# Patient Record
Sex: Male | Born: 1985
Health system: Southern US, Community
[De-identification: ages and names within clinical notes are randomized; demographics above are authoritative.]

## PROBLEM LIST (undated history)

## (undated) DIAGNOSIS — K219 Gastro-esophageal reflux disease without esophagitis: Secondary | ICD-10-CM

## (undated) DIAGNOSIS — N200 Calculus of kidney: Secondary | ICD-10-CM

## (undated) HISTORY — PX: NO PAST SURGERIES: SHX2092

---

## 2010-08-29 ENCOUNTER — Ambulatory Visit: Payer: Self-pay

## 2010-09-09 ENCOUNTER — Ambulatory Visit: Payer: Self-pay

## 2013-03-13 ENCOUNTER — Emergency Department: Payer: Self-pay | Admitting: Emergency Medicine

## 2015-05-20 DIAGNOSIS — R6882 Decreased libido: Secondary | ICD-10-CM | POA: Insufficient documentation

## 2015-05-20 DIAGNOSIS — K219 Gastro-esophageal reflux disease without esophagitis: Secondary | ICD-10-CM | POA: Insufficient documentation

## 2015-05-21 ENCOUNTER — Ambulatory Visit (INDEPENDENT_AMBULATORY_CARE_PROVIDER_SITE_OTHER): Payer: 59 | Admitting: Family Medicine

## 2015-05-21 ENCOUNTER — Encounter: Payer: Self-pay | Admitting: Family Medicine

## 2015-05-21 VITALS — BP 140/90 | HR 70 | Temp 98.0°F | Resp 17 | Wt 253.0 lb

## 2015-05-21 DIAGNOSIS — L259 Unspecified contact dermatitis, unspecified cause: Secondary | ICD-10-CM | POA: Diagnosis not present

## 2015-05-21 MED ORDER — PREDNISONE 10 MG PO TABS: 10.0000 mg | ORAL_TABLET | Freq: Every day | ORAL | Status: DC

## 2015-05-21 NOTE — Progress Notes (Signed)
Patient: Troy Sawyer Male    DOB: 1986/02/01   29 y.o.   MRN: [id number] Visit Date: 05/21/2015  Today's Provider: Dortha Kern, PA   Chief Complaint  Patient presents with  . Rash    Possible Poison Oak; arms, legs, and stomach   Subjective:    Rash This is a new problem. The current episode started in the past 7 days. The problem has been gradually worsening since onset. The affected locations include the left arm, right arm, right lowerleg, right upper leg, left lower leg and left upper leg. The rash is characterized by draining, blistering, itchiness, redness and swelling. He was exposed to plant contact. Associated symptoms include facial edema. Pertinent negatives include no fever, joint pain or sore throat. (Just eyes a little puffy) Past treatments include anti-itch cream (cortisone 2.5% and  1%). The treatment provided no relief.     History reviewed. No pertinent past medical history. Patient Active Problem List   Diagnosis Date Noted  . Decreased libido 05/20/2015  . Acid reflux 05/20/2015   Family History  Problem Relation Age of Onset  . Healthy Mother   . Diabetes Father   . Healthy Sister   . Healthy Brother   . Lung cancer Paternal Grandfather   . Liver cancer Paternal Grandfather   . Healthy Sister   . Healthy Brother   . Healthy Brother    Past Surgical History  Procedure Laterality Date  . No past surgeries      No Known Allergies   Previous Medications   RANITIDINE (ZANTAC) 75 MG TABLET    Take 1 tablet by mouth as needed.    Review of Systems  Constitutional: Negative for fever.  HENT: Negative for sore throat.   Musculoskeletal: Negative for joint pain.  Skin: Positive for rash.    History  Substance Use Topics  . Smoking status: Former Games developer  . Smokeless tobacco: Never Used     Comment: quit in 2004  . Alcohol Use: 0.0 oz/week    0 Standard drinks or equivalent per week     Comment: OCCASIONALLY   Objective:   BP 140/90  mmHg  Pulse 70  Temp(Src) 98 F (36.7 C) (Oral)  Resp 17  Wt 253 lb (114.76 kg)  Physical Exam  Constitutional: He is oriented to person, place, and time. He appears well-developed and well-nourished. No distress.  HENT:  Head: Normocephalic and atraumatic.  Right Ear: Hearing normal.  Left Ear: Hearing normal.  Nose: Nose normal.  Eyes: Conjunctivae and lids are normal. Right eye exhibits no discharge. Left eye exhibits no discharge. No scleral icterus.  Pulmonary/Chest: Effort normal. No respiratory distress.  Musculoskeletal: Normal range of motion.  Neurological: He is alert and oriented to person, place, and time.  Skin: Skin is intact. Rash noted. No lesion noted.  Large areas of vesicular pruritic rash in a linear pattern on forearms, inside of upper arms, heavily around ankles up lower legs and top of feet. Couple of very small lesions on abdomen and some itching around left eye.  Psychiatric: He has a normal mood and affect. His speech is normal and behavior is normal. Thought content normal.      Assessment & Plan:     1. Contact dermatitis Onset of very pruritic rash over the past 7 days. Started after mowing in yard. Sprayed all weeds he could find after starting the itching. Suspect rhus dermatitis. Will treat with prednisone taper, antihistamine (OTC) and  [place] compresses/soaks. Recheck prn. - predniSONE (DELTASONE) 10 MG tablet; Take 1 tablet (10 mg total) by mouth daily with breakfast. Taper as directed by mouth (6,5,4,3,2,1)  Dispense: 21 tablet; Refill: 0       Dortha Kern, PA  [facility]

## 2015-05-21 NOTE — Patient Instructions (Signed)

## 2015-09-02 ENCOUNTER — Ambulatory Visit (INDEPENDENT_AMBULATORY_CARE_PROVIDER_SITE_OTHER): Payer: 59 | Admitting: Family Medicine

## 2015-09-02 ENCOUNTER — Encounter: Payer: Self-pay | Admitting: Family Medicine

## 2015-09-02 VITALS — BP 128/84 | HR 88 | Temp 98.3°F | Resp 18 | Wt 254.8 lb

## 2015-09-02 DIAGNOSIS — R05 Cough: Secondary | ICD-10-CM | POA: Diagnosis not present

## 2015-09-02 DIAGNOSIS — J0100 Acute maxillary sinusitis, unspecified: Secondary | ICD-10-CM

## 2015-09-02 DIAGNOSIS — R6883 Chills (without fever): Secondary | ICD-10-CM | POA: Diagnosis not present

## 2015-09-02 DIAGNOSIS — R059 Cough, unspecified: Secondary | ICD-10-CM

## 2015-09-02 LAB — POC INFLUENZA A&B (BINAX/QUICKVUE)
INFLUENZA A, POC: NEGATIVE
INFLUENZA B, POC: NEGATIVE

## 2015-09-02 MED ORDER — AMOXICILLIN 875 MG PO TABS: 875.0000 mg | ORAL_TABLET | Freq: Two times a day (BID) | ORAL | Status: DC

## 2015-09-02 NOTE — Progress Notes (Signed)
Patient ID: Troy Sawyer, male   DOB: 1986/06/08, 29 y.o.   MRN: [id number] Name: Troy Sawyer   MRN: [id number]    DOB: Jun 06, 1986   Date:09/02/2015       Progress Note  Subjective  Chief Complaint  Chief Complaint  Patient presents with  . URI   URI  This is a new problem. The current episode started in the past 7 days. The problem has been gradually worsening. Associated symptoms include congestion, coughing, headaches and a sore throat. Associated symptoms comments: Some chills and sweats.. He has tried decongestant for the symptoms. The treatment provided no relief.   Patient Active Problem List   Diagnosis Date Noted  . Decreased libido 05/20/2015  . Acid reflux 05/20/2015   Family History  Problem Relation Age of Onset  . Healthy Mother   . Diabetes Father   . Healthy Sister   . Healthy Brother   . Lung cancer Paternal Grandfather   . Liver cancer Paternal Grandfather   . Healthy Sister   . Healthy Brother   . Healthy Brother    Past Surgical History  Procedure Laterality Date  . No past surgeries     Social History  Substance Use Topics  . Smoking status: Former Games developer  . Smokeless tobacco: Never Used     Comment: quit in 2004  . Alcohol Use: 0.0 oz/week    0 Standard drinks or equivalent per week     Comment: OCCASIONALLY    Current outpatient prescriptions:  .  ranitidine (ZANTAC) 75 MG tablet, Take 1 tablet by mouth as needed., Disp: , Rfl:   No Known Allergies  Review of Systems  Constitutional: Negative.   HENT: Positive for congestion and sore throat.   Eyes: Negative.   Respiratory: Positive for cough.   Cardiovascular: Negative.   Gastrointestinal: Negative.   Genitourinary: Negative.   Musculoskeletal: Negative.   Skin: Negative.   Neurological: Positive for headaches.  Endo/Heme/Allergies: Negative.   Psychiatric/Behavioral: Negative.    Objective  Filed Vitals:   09/02/15 1053  BP: 128/84  Pulse: 88  Temp: 98.3 F (36.8 C)   TempSrc: Oral  Resp: 18  Weight: 254 lb 12.8 oz (115.577 kg)  SpO2: 97%   Physical Exam  Constitutional: He is oriented to person, place, and time and well-developed, well-nourished, and in no distress.  HENT:  Head: Normocephalic and atraumatic.  Right Ear: External ear normal.  Left Ear: External ear normal.  Prominent tonsils without exudates. Ache in maxillary and frontal sinuses with poor transillumination.  Eyes: Conjunctivae and EOM are normal.  Neck: Normal range of motion. Neck supple.  Cardiovascular: Normal rate, regular rhythm and normal heart sounds.   Pulmonary/Chest: Effort normal. He has no wheezes. He has no rales.  Coarse breath sounds with questionable rhonchi initially that clears with a deep breath.  Musculoskeletal: Normal range of motion.  Lymphadenopathy:    He has no cervical adenopathy.  Neurological: He is alert and oriented to person, place, and time.  Skin: Skin is warm and dry.  Psychiatric: Affect and judgment normal.   Assessment & Plan  1. Chills Onset 08-30-15 without documented fever. Flu test negative. May use Advil prn and increase fluid intake.  - POC Influenza A&B (Binax test)  2. Cough Some yellow is sputum at night. May use Mucinex-DM and take all antibiotics given. Recheck prn. - POC Influenza A&B (Binax test)  3. Subacute maxillary sinusitis Onset over the past 2-3 days. No transillumination of maxillary sinuses.  Use saline nasal spray and add antibiotic. Recheck if no better in 5-7 days. - amoxicillin (AMOXIL) 875 MG tablet; Take 1 tablet (875 mg total) by mouth 2 (two) times daily.  Dispense: 20 tablet; Refill: 0

## 2016-03-24 ENCOUNTER — Ambulatory Visit (INDEPENDENT_AMBULATORY_CARE_PROVIDER_SITE_OTHER): Payer: 59 | Admitting: Family Medicine

## 2016-03-24 ENCOUNTER — Encounter: Payer: Self-pay | Admitting: Family Medicine

## 2016-03-24 VITALS — BP 128/76 | HR 68 | Temp 98.1°F | Resp 16 | Wt 250.0 lb

## 2016-03-24 DIAGNOSIS — R109 Unspecified abdominal pain: Secondary | ICD-10-CM | POA: Diagnosis not present

## 2016-03-24 DIAGNOSIS — K219 Gastro-esophageal reflux disease without esophagitis: Secondary | ICD-10-CM

## 2016-03-24 LAB — POCT UA - MICROSCOPIC ONLY

## 2016-03-24 LAB — POCT URINALYSIS DIPSTICK
GLUCOSE UA: NEGATIVE
Leukocytes, UA: NEGATIVE
Nitrite, UA: NEGATIVE
SPEC GRAV UA: 1.025
UROBILINOGEN UA: 0.2
pH, UA: 6

## 2016-03-24 MED ORDER — CIPROFLOXACIN HCL 500 MG PO TABS: 500.0000 mg | ORAL_TABLET | Freq: Two times a day (BID) | ORAL | Status: DC

## 2016-03-24 NOTE — Progress Notes (Signed)
Patient ID: Troy Sawyer, male   DOB: [birth year], 30 y.o.   MRN: [id number]       Patient: Troy Sawyer Male    DOB: June 07, 1986   30 y.o.   MRN: [id number] Visit Date: 03/24/2016  Today's Provider: Dortha Kern, PA   Chief Complaint  Patient presents with  . Gastroesophageal Reflux    X 1 week.    Subjective:    Gastroesophageal Reflux He complains of belching and heartburn. He reports no abdominal pain or no nausea. This is a recurrent problem. The current episode started in the past 7 days. The problem occurs frequently. The problem has been gradually worsening. The heartburn is of moderate intensity. The symptoms are aggravated by certain foods and lying down. He has tried a PPI and an antacid for the symptoms. The treatment provided mild relief.  Had a sharp pain in LLQ a week ago but only lasted a few minutes. Couple days later had N&V after a night of heavy drinking then developed another bout of LLQ pain that radiated to the left flank. Denies dysuria or fever. Had an intense pain in the flank last night with darker urine.   No past medical history on file. Patient Active Problem List   Diagnosis Date Noted  . Decreased libido 05/20/2015  . Acid reflux 05/20/2015   Past Surgical History  Procedure Laterality Date  . No past surgeries     Family History  Problem Relation Age of Onset  . Healthy Mother   . Diabetes Father   . Healthy Sister   . Healthy Brother   . Lung cancer Paternal Grandfather   . Liver cancer Paternal Grandfather   . Healthy Sister   . Healthy Brother   . Healthy Brother    No Known Allergies   Previous Medications   RANITIDINE (ZANTAC) 75 MG TABLET    Take 1 tablet by mouth as needed.    Review of Systems  Constitutional: Negative.   Cardiovascular: Negative.   Gastrointestinal: Positive for heartburn. Negative for nausea, vomiting, abdominal pain, diarrhea and constipation.    Social History  Substance Use Topics  . Smoking status:  Former Games developer  . Smokeless tobacco: Never Used     Comment: quit in 2004  . Alcohol Use: 0.0 oz/week    0 Standard drinks or equivalent per week     Comment: OCCASIONALLY   Objective:   BP 128/76 mmHg  Pulse 68  Temp(Src) 98.1 F (36.7 C)  Resp 16  Wt 250 lb (113.399 kg)  Physical Exam  Constitutional: He is oriented to person, place, and time. He appears well-developed and well-nourished. No distress.  HENT:  Head: Normocephalic and atraumatic.  Right Ear: Hearing normal.  Left Ear: Hearing normal.  Nose: Nose normal.  Eyes: Conjunctivae and lids are normal. Right eye exhibits no discharge. Left eye exhibits no discharge. No scleral icterus.  Neck: Neck supple.  Cardiovascular: Regular rhythm.   Pulmonary/Chest: Effort normal and breath sounds normal. No respiratory distress.  Abdominal: Bowel sounds are normal. There is tenderness.  Slight left CVA tenderness to percussion posteriorly and around flank to LLQ to palpation.  Musculoskeletal: Normal range of motion.  Neurological: He is alert and oriented to person, place, and time.  Skin: Skin is intact. No lesion and no rash noted.  Psychiatric: He has a normal mood and affect. His speech is normal and behavior is normal. Thought content normal.      Assessment & Plan:  1. Gastroesophageal reflux disease, esophagitis presence not specified Worse over the past week. Denies hematemesis or melena. Recommend he increase Zantac use to 150 mg BID and limit ETOH or spicy foods. Will check labs and recheck pending reports.  - CBC with Differential/Platelet - Comprehensive metabolic panel  2. Left flank pain Onset over the past week intermittently with N&V. Denies dysuria but flank pain very intense when it occurs. Urinalysis showed some WBC's and bacteria with RBC's. Will check renal ultrasound and urine culture. Given Cipro 500 mg BID and recheck pending reports. - POCT urinalysis dipstick - US Renal - CBC with  Differential/Platelet - Comprehensive metabolic panel       Dortha Kernennis Chrismon, PA  [facility]

## 2016-03-25 LAB — COMPREHENSIVE METABOLIC PANEL
A/G RATIO: 1.8 (ref 1.2–2.2)
ALK PHOS: 63 IU/L (ref 39–117)
ALT: 25 IU/L (ref 0–44)
AST: 14 IU/L (ref 0–40)
Albumin: 4.7 g/dL (ref 3.5–5.5)
BILIRUBIN TOTAL: 0.6 mg/dL (ref 0.0–1.2)
BUN / CREAT RATIO: 12 (ref 9–20)
BUN: 14 mg/dL (ref 6–20)
CALCIUM: 9.5 mg/dL (ref 8.7–10.2)
CHLORIDE: 99 mmol/L (ref 96–106)
CO2: 25 mmol/L (ref 18–29)
Creatinine, Ser: 1.17 mg/dL (ref 0.76–1.27)
GFR calc Af Amer: 95 mL/min/{1.73_m2} (ref >59)
GFR calc non Af Amer: 82 mL/min/{1.73_m2} (ref >59)
Globulin, Total: 2.6 g/dL (ref 1.5–4.5)
Glucose: 90 mg/dL (ref 65–99)
Potassium: 4.7 mmol/L (ref 3.5–5.2)
Sodium: 140 mmol/L (ref 134–144)
TOTAL PROTEIN: 7.3 g/dL (ref 6.0–8.5)

## 2016-03-25 LAB — CBC WITH DIFFERENTIAL/PLATELET
BASOS: 0 %
Basophils Absolute: 0 10*3/uL (ref 0.0–0.2)
EOS (ABSOLUTE): 0.3 10*3/uL (ref 0.0–0.4)
EOS: 4 %
Hematocrit: 44.3 % (ref 37.5–51.0)
Hemoglobin: 15 g/dL (ref 12.6–17.7)
IMMATURE GRANS (ABS): 0 10*3/uL (ref 0.0–0.1)
Immature Granulocytes: 0 %
Lymphocytes Absolute: 1.9 10*3/uL (ref 0.7–3.1)
Lymphs: 28 %
MCH: 30.3 pg (ref 26.6–33.0)
MCHC: 33.9 g/dL (ref 31.5–35.7)
MCV: 90 fL (ref 79–97)
MONOS ABS: 0.7 10*3/uL (ref 0.1–0.9)
Monocytes: 11 %
NEUTROS ABS: 3.8 10*3/uL (ref 1.4–7.0)
NEUTROS PCT: 57 %
Platelets: 224 10*3/uL (ref 150–379)
RBC: 4.95 x10E6/uL (ref 4.14–5.80)
RDW: 12.9 % (ref 12.3–15.4)
WBC: 6.8 10*3/uL (ref 3.4–10.8)

## 2016-03-25 LAB — URINE CULTURE: Organism ID, Bacteria: NO GROWTH

## 2016-03-26 ENCOUNTER — Ambulatory Visit
Admission: RE | Admit: 2016-03-26 | Discharge: 2016-03-26 | Disposition: A | Discharge status: Home / Self Care | Payer: 59 | Source: Ambulatory Visit | Attending: Family Medicine | Admitting: Family Medicine

## 2016-03-26 DIAGNOSIS — N1330 Unspecified hydronephrosis: Secondary | ICD-10-CM | POA: Insufficient documentation

## 2016-03-26 DIAGNOSIS — R109 Unspecified abdominal pain: Secondary | ICD-10-CM | POA: Diagnosis present

## 2016-03-27 ENCOUNTER — Telehealth: Payer: Self-pay | Admitting: Family Medicine

## 2016-03-27 ENCOUNTER — Ambulatory Visit: Payer: 59 | Admitting: Family Medicine

## 2016-03-27 DIAGNOSIS — N200 Calculus of kidney: Secondary | ICD-10-CM

## 2016-03-27 MED ORDER — TAMSULOSIN HCL 0.4 MG PO CAPS: 0.4000 mg | ORAL_CAPSULE | Freq: Every day | ORAL | Status: DC

## 2016-03-27 MED ORDER — HYDROCODONE-ACETAMINOPHEN 5-325 MG PO TABS: 1.0000 | ORAL_TABLET | Freq: Four times a day (QID) | ORAL | Status: DC | PRN

## 2016-03-27 NOTE — Telephone Encounter (Signed)
Patient wanted to know due to the kidney stones, does he need to stay away from certain foods? Also, patient wanted to know if he could have some pain medication to help with his occasional pain that he mentioned to you during his OV. Patient also wanted pain medication just in case passing the stone will cause him to be in a lot of pain.

## 2016-03-27 NOTE — Telephone Encounter (Signed)
Advise patient a prescription for pain medication will be at the front desk for pick up. If he can't get here today, the office will be open 9am-12pm tomorrow. This type of scheduled drug must be written and hand signed - can't be electronically transmitted to the pharmacy. Remember these drugs can cause drowsiness/sleepiness.

## 2016-03-27 NOTE — Telephone Encounter (Signed)
Pt wants to talk to you regarding his US yesterday on his kidneys.  His call back is [phone]  Thanks Barth Kirkseri

## 2016-03-27 NOTE — Telephone Encounter (Signed)
Pt is requesting a call back to discuss kidney stones that were found today.  ZO#[phone]/WJCB#[phone]/MW

## 2016-03-27 NOTE — Telephone Encounter (Signed)
Advised patient as below.  

## 2016-03-27 NOTE — Telephone Encounter (Signed)
-----   Message from Dennis Tamsen RoersE Chrismon, [place] sent at 03/27/2016  8:56 AM EDT ----- Some mild swelling of both kidneys. Some stones in the left kidney. Recommend adding Flomax 0.4 mg qd #30 (phone into his pharmacy) to help with relieving pressure on kidneys and hopefully passing stones. Recheck urine and renal function blood tests in 10-14 days.

## 2016-03-27 NOTE — Telephone Encounter (Signed)
Called patient and advised as below. Medication was called into the pharmacy. Patient reports that he will call back and schedule and appt due to work schedule.

## 2016-03-31 ENCOUNTER — Telehealth: Payer: Self-pay | Admitting: Emergency Medicine

## 2016-03-31 NOTE — Telephone Encounter (Signed)
Pt would like a call back from nurse/him, he has questions regarding being treated for kidney stone.

## 2016-03-31 NOTE — Telephone Encounter (Signed)
Spoke with patient and asked about drinking caffeine, and what to do to speed the process up for passing kidney stones. Went over precautions and educational material in regards to the issue with patient-aa

## 2016-04-09 ENCOUNTER — Telehealth: Payer: Self-pay | Admitting: Urology

## 2016-04-09 ENCOUNTER — Emergency Department: Payer: Commercial Managed Care - HMO

## 2016-04-09 ENCOUNTER — Encounter: Payer: Self-pay | Admitting: Emergency Medicine

## 2016-04-09 ENCOUNTER — Emergency Department
Admission: EM | Admit: 2016-04-09 | Discharge: 2016-04-09 | Disposition: A | Discharge status: Home / Self Care | Payer: Commercial Managed Care - HMO | Attending: Emergency Medicine | Admitting: Emergency Medicine

## 2016-04-09 DIAGNOSIS — R109 Unspecified abdominal pain: Secondary | ICD-10-CM

## 2016-04-09 DIAGNOSIS — N23 Unspecified renal colic: Secondary | ICD-10-CM

## 2016-04-09 DIAGNOSIS — Z79899 Other long term (current) drug therapy: Secondary | ICD-10-CM | POA: Insufficient documentation

## 2016-04-09 DIAGNOSIS — Z87891 Personal history of nicotine dependence: Secondary | ICD-10-CM | POA: Insufficient documentation

## 2016-04-09 DIAGNOSIS — N200 Calculus of kidney: Secondary | ICD-10-CM

## 2016-04-09 DIAGNOSIS — R1032 Left lower quadrant pain: Secondary | ICD-10-CM | POA: Diagnosis present

## 2016-04-09 DIAGNOSIS — R10A0 Flank pain, unspecified side: Secondary | ICD-10-CM

## 2016-04-09 LAB — BASIC METABOLIC PANEL
Anion gap: 11 (ref 5–15)
BUN: 21 mg/dL — AB (ref 6–20)
CO2: 24 mmol/L (ref 22–32)
CREATININE: 1.62 mg/dL — AB (ref 0.61–1.24)
Calcium: 9.4 mg/dL (ref 8.9–10.3)
Chloride: 104 mmol/L (ref 101–111)
GFR calc Af Amer: >60 mL/min (ref >60)
GFR, EST NON AFRICAN AMERICAN: 55 mL/min — AB (ref >60)
GLUCOSE: 125 mg/dL — AB (ref 65–99)
POTASSIUM: 4.1 mmol/L (ref 3.5–5.1)
SODIUM: 139 mmol/L (ref 135–145)

## 2016-04-09 LAB — CBC
HCT: 40.5 % (ref 40.0–52.0)
Hemoglobin: 14 g/dL (ref 13.0–18.0)
MCH: 30.2 pg (ref 26.0–34.0)
MCHC: 34.6 g/dL (ref 32.0–36.0)
MCV: 87.4 fL (ref 80.0–100.0)
PLATELETS: 211 10*3/uL (ref 150–440)
RBC: 4.63 MIL/uL (ref 4.40–5.90)
RDW: 12.4 % (ref 11.5–14.5)
WBC: 11.3 10*3/uL — ABNORMAL HIGH (ref 3.8–10.6)

## 2016-04-09 LAB — URINALYSIS COMPLETE WITH MICROSCOPIC (ARMC ONLY)
BILIRUBIN URINE: NEGATIVE
Bacteria, UA: NONE SEEN
Glucose, UA: NEGATIVE mg/dL
Leukocytes, UA: NEGATIVE
Nitrite: NEGATIVE
PH: 6 (ref 5.0–8.0)
PROTEIN: NEGATIVE mg/dL
SQUAMOUS EPITHELIAL / LPF: NONE SEEN
Specific Gravity, Urine: 1.021 (ref 1.005–1.030)

## 2016-04-09 MED ORDER — ONDANSETRON 4 MG PO TBDP: 4.0000 mg | ORAL_TABLET | Freq: Three times a day (TID) | ORAL | Status: DC | PRN

## 2016-04-09 MED ORDER — MORPHINE SULFATE (PF) 4 MG/ML IV SOLN
INTRAVENOUS | Status: AC
  Administered 2016-04-09: 4 mg via INTRAVENOUS
  Filled 2016-04-09: qty 1

## 2016-04-09 MED ORDER — MORPHINE SULFATE (PF) 4 MG/ML IV SOLN
4.0000 mg | Freq: Once | INTRAVENOUS | Status: AC
  Administered 2016-04-09: 4 mg via INTRAVENOUS

## 2016-04-09 MED ORDER — SODIUM CHLORIDE 0.9 % IV BOLUS (SEPSIS)
1000.0000 mL | Freq: Once | INTRAVENOUS | Status: AC
  Administered 2016-04-09: 1000 mL via INTRAVENOUS

## 2016-04-09 MED ORDER — ONDANSETRON HCL 4 MG/2ML IJ SOLN
INTRAMUSCULAR | Status: AC
  Administered 2016-04-09: 4 mg via INTRAVENOUS
  Filled 2016-04-09: qty 2

## 2016-04-09 MED ORDER — OXYCODONE-ACETAMINOPHEN 5-325 MG PO TABS: 1.0000 | ORAL_TABLET | Freq: Four times a day (QID) | ORAL | Status: DC | PRN

## 2016-04-09 MED ORDER — MORPHINE SULFATE (PF) 2 MG/ML IV SOLN
INTRAVENOUS | Status: AC
  Administered 2016-04-09: 4 mg via INTRAVENOUS
  Filled 2016-04-09: qty 2

## 2016-04-09 MED ORDER — MORPHINE SULFATE (PF) 4 MG/ML IV SOLN
4.0000 mg | Freq: Once | INTRAVENOUS | Status: AC
Start: 2016-04-09 — End: 2016-04-09
  Administered 2016-04-09: 4 mg via INTRAVENOUS
  Filled 2016-04-09: qty 1

## 2016-04-09 MED ORDER — ONDANSETRON HCL 4 MG/2ML IJ SOLN
4.0000 mg | Freq: Once | INTRAMUSCULAR | Status: AC
  Administered 2016-04-09: 4 mg via INTRAVENOUS

## 2016-04-09 MED ORDER — OXYCODONE-ACETAMINOPHEN 5-325 MG PO TABS: 1.0000 | ORAL_TABLET | Freq: Once | ORAL | Status: DC

## 2016-04-09 NOTE — ED Notes (Addendum)
Pt reports left flank/side pain since 530pm yesterday accomp by N/V; [facility]; denies hx of same

## 2016-04-09 NOTE — Telephone Encounter (Signed)
Notified pt of surgery scheduled 04/13/16 with arrival time to pre-admit testing at 7:30am. Advised pt to be npo after mn, do not take any medications but bring them to the appt & to have a driver accompany pt. Pt voices understanding.

## 2016-04-09 NOTE — ED Notes (Signed)
Pt to CT

## 2016-04-09 NOTE — Consult Note (Signed)
Urology Consult  I have been asked to see the patient by Dr. Zenda AlpersWebster, for evaluation and management of left ureteral stone.  Chief Complaint: left flank pain  History of Present Illness: Troy Sawyer is a 30 y.o. year old who presented to the emergency room today with acute onset left flank pain. This has intermittently occurring from at least 3 weeks with episodes of severe pain with associated nausea and vomiting.  The pain is primarily in his left lower quadrant radiating down to his left groin.  This morning, the pain was so severe that it brought him to the emergency room.  He has been taking Flomax and pain meds prescribed by his PCP, Dr. Dossie Arbourrissman.  He denies any fevers or chills. No leukocytosis. UA is unremarkable. His creatinine is mildly elevated to 1.5 although he is quite muscular. Baseline creatinine unknown.  CT scan shows a 6 mm left mid/distal ureteral stone around the level of the iliacs. There is proximal hydroureteronephrosis. There are no upper tract nonobstructing nephrolithiasis bilaterally.  No previous history of kidney stones.  History reviewed. No pertinent past medical history.   (GERD)  Past Surgical History  Procedure Laterality Date  . No past surgeries      Home Medications:    Medication List    TAKE these medications        ondansetron 4 MG disintegrating tablet  Commonly known as:  ZOFRAN ODT  Take 1 tablet (4 mg total) by mouth every 8 (eight) hours as needed for nausea or vomiting.     oxyCODONE-acetaminophen 5-325 MG tablet  Commonly known as:  ROXICET  Take 1 tablet by mouth every 6 (six) hours as needed.      ASK your doctor about these medications        ciprofloxacin 500 MG tablet  Commonly known as:  CIPRO  Take 1 tablet (500 mg total) by mouth 2 (two) times daily.     HYDROcodone-acetaminophen 5-325 MG tablet  Commonly known as:  NORCO/VICODIN  Take 1 tablet by mouth every 6 (six) hours as needed for moderate pain.     ranitidine 75 MG tablet  Commonly known as:  ZANTAC  Take 1 tablet by mouth as needed.     tamsulosin 0.4 MG Caps capsule  Commonly known as:  FLOMAX  Take 1 capsule (0.4 mg total) by mouth daily.        Allergies: No Known Allergies  Family History  Problem Relation Age of Onset  . Healthy Mother   . Diabetes Father   . Healthy Sister   . Healthy Brother   . Lung cancer Paternal Grandfather   . Liver cancer Paternal Grandfather   . Healthy Sister   . Healthy Brother   . Healthy Brother     Social History:  reports that he has quit smoking. He has never used smokeless tobacco. He reports that he drinks alcohol. He reports that he does not use illicit drugs.  ROS: A complete review of systems was performed.  All systems are negative except for pertinent findings as noted.  Physical Exam:  Vital signs in last 24 hours: Temp:  [98 F (36.7 C)] 98 F (36.7 C) (06/22 0221) Pulse Rate:  [65-84] 65 (06/22 0630) Resp:  [20-22] 20 (06/22 0251) BP: (102-127)/(59-87) 106/59 mmHg (06/22 0630) SpO2:  [92 %-98 %] 97 % (06/22 0630) Weight:  [243 lb (110.224 kg)] 243 lb (110.224 kg) (06/22 0221) Constitutional:  Alert and oriented, No acute  distress HEENT: [place] AT, moist mucus membranes.  Trachea midline, no masses Cardiovascular: Regular rate and rhythm, no clubbing, cyanosis, or edema. Respiratory: Normal respiratory effort, lungs clear bilaterally GI: Abdomen is soft, nondistended, no abdominal masses.  Mild LLQ pain.   GU: No CVA tenderness Skin: No rashes, bruises or suspicious lesions Lymph: No cervical adenopathy Neurologic: Grossly intact, no focal deficits, moving all 4 extremities Psychiatric: Normal mood and affect   Laboratory Data:   Recent Labs  04/09/16 0226  WBC 11.3*  HGB 14.0  HCT 40.5    Recent Labs  04/09/16 0226  NA 139  K 4.1  CL 104  CO2 24  GLUCOSE 125*  BUN 21*  CREATININE 1.62*  CALCIUM 9.4   No results for input(s): LABPT, INR in the  last 72 hours. No results for input(s): LABURIN in the last 72 hours. Results for orders placed or performed in visit on 03/24/16  Urine culture     Status: None   Collection Time: 03/24/16  9:30 AM  Result Value Ref Range Status   Urine Culture, Routine Final report  Final   Urine Culture result 1 No growth  Final     Component     Latest Ref Rng 04/09/2016  Color, Urine     YELLOW YELLOW (A)  Appearance     CLEAR CLEAR (A)  Glucose     NEGATIVE mg/dL NEGATIVE  Bilirubin Urine     NEGATIVE NEGATIVE  Ketones, ur     NEGATIVE mg/dL 1+ (A)  Specific Gravity, Urine     1.005 - 1.030 1.021  Hgb urine dipstick     NEGATIVE 2+ (A)  pH     5.0 - 8.0 6.0  Protein     NEGATIVE mg/dL NEGATIVE  Nitrite     NEGATIVE NEGATIVE  Leukocytes, UA     NEGATIVE NEGATIVE  RBC / HPF     0 - 5 RBC/hpf 6-30  WBC, UA     0 - 5 WBC/hpf 0-5  Bacteria, UA     NONE SEEN NONE SEEN  Squamous Epithelial / LPF     NONE SEEN NONE SEEN  Mucous      PRESENT     Radiologic Imaging: Dg Abd 1 View  04/09/2016  CLINICAL DATA:  Acute left-sided abdominal pain. EXAM: ABDOMEN - 1 VIEW COMPARISON:  CT scan of same day. FINDINGS: The bowel gas pattern is normal. The distal left ureteral calculus noted on prior CT scan is not well-visualized, potentially because it may be overlying the sacrum. No definite renal calculi are noted. IMPRESSION: No evidence of bowel obstruction or ileus. Distal left ureteral calculus noted on prior CT scan is not well visualized on this radiograph, potentially because it may be overlying the sacrum. Electronically Signed   By: James  Green Jr, M.D.   On: 04/09/2016 08:17   Ct Renal Stone Study  04/09/2016  CLINICAL DATA:  Acute onset of left-sided flank pain, nausea and vomiting. Initial encounter. EXAM: CT ABDOMEN AND PELVIS WITHOUT CONTRAST TECHNIQUE: Multidetector CT imaging of the abdomen and pelvis was performed following the standard protocol without IV contrast. COMPARISON:   Renal ultrasound performed 03/26/2016 FINDINGS: The visualized lung bases are clear. The liver and spleen are unremarkable in appearance. The gallbladder is within normal limits. The pancreas and adrenal glands are unremarkable. Mild left-sided hydronephrosis is noted, with left-sided perinephric stranding, and prominence of the left ureter to the level of an obstructing 6 x 5 mm stone   at the mid left ureter, approximately 10 cm proximal to the left vesicoureteral junction. The right kidney is unremarkable in appearance. No nonobstructing renal stones are identified. No free fluid is identified. There is fatty infiltration of the wall of the distal ileum, which may reflect sequelae of chronic inflammation. The remainder of the small bowel is unremarkable. The stomach is within normal limits. No acute vascular abnormalities are seen. The appendix is normal in caliber, without evidence of appendicitis. The colon is unremarkable in appearance. The bladder is mildly distended and grossly unremarkable. The prostate remains normal in size. No inguinal lymphadenopathy is seen. No acute osseous abnormalities are identified. IMPRESSION: 1. Mild left-sided hydronephrosis, with an obstructing 6 x 5 mm stone at the left mid ureter, approximately 10 cm proximal to the left vesicoureteral junction. 2. Fatty infiltration of the wall of the distal ileum may reflect sequelae of chronic inflammation. Small bowel otherwise unremarkable in appearance. Electronically Signed   By: Roanna RaiderJeffery  Chang M.D.   On: 04/09/2016 03:27   KUB- stone not well visualized   Impression/Assessment:  30 year old male with a 6 mm left mid/distal obstructing ureteral stone, mild acute kidney injury.  He is hemodynamically stable, afebrile, without evidence of infection. No acute intervention necessary at this time.  We discussed various treatment options including ESWL vs. ureteroscopy, laser lithotripsy, and stent vs. Continued medical expulsive  therapy. We discussed the risks and benefits of both including bleeding, infection, damage to surrounding structures, efficacy with need for possible further intervention, and need for temporary ureteral stent.  Given that the stone is difficult to visualize and KUB today likely secondary to the sacral bone, I feel that ureteroscopy may be the best option. Given that the stone has failed to pass in greater than 3 weeks, I do feel that it is likely time for intervention. This was communicated to the patient.   He is elected to proceed with left ureteroscopy, laser lithotripsy, left ureteral stent placement. We will arrange for him to be on the schedule for early next week for this procedure.    Warnings symptoms are reviewed and instructions return to the ER should he develop any urinary tract symptoms were fevers.  Plan:  -schedule L URS, LL, stent  -continue flomax, pain meds, strain urine until surgery  04/09/2016, 8:24 AM  Vanna ScotlandAshley Shavon Zenz,  MD

## 2016-04-09 NOTE — Discharge Instructions (Signed)
Flank Pain Flank pain refers to pain that is located on the side of the body between the upper abdomen and the back. The pain may occur over a short period of time (acute) or may be long-term or reoccurring (chronic). It may be mild or severe. Flank pain can be caused by many things. CAUSES  Some of the more common causes of flank pain include:  Muscle strains.   Muscle spasms.   A disease of your spine (vertebral disk disease).   A lung infection (pneumonia).   Fluid around your lungs (pulmonary edema).   A kidney infection.   Kidney stones.   A very painful skin rash caused by the chickenpox virus (shingles).   Gallbladder disease.  [place] care will depend on the cause of your pain. In general,  Rest as directed by your caregiver.  Drink enough fluids to keep your urine clear or pale yellow.  Only take over-the-counter or prescription medicines as directed by your caregiver. Some medicines may help relieve the pain.  Tell your caregiver about any changes in your pain.  Follow up with your caregiver as directed. SEEK IMMEDIATE MEDICAL CARE IF:   Your pain is not controlled with medicine.   You have new or worsening symptoms.  Your pain increases.   You have abdominal pain.   You have shortness of breath.   You have persistent nausea or vomiting.   You have swelling in your abdomen.   You feel faint or pass out.   You have blood in your urine.  You have a fever or persistent symptoms for more than 2-3 days.  You have a fever and your symptoms suddenly get worse. MAKE SURE YOU:   Understand these instructions.  Will watch your condition.  Will get help right away if you are not doing well or get worse.   This information is not intended to replace advice given to you by your health care provider. Make sure you discuss any questions you have with your health care provider.   Document Released: 11/26/2005 Document  Revised: 06/29/2012 Document Reviewed: 05/19/2012 Elsevier Interactive Patient Education 2016 [place].  Kidney Stones Kidney stones (urolithiasis) are deposits that form inside your kidneys. The intense pain is caused by the stone moving through the urinary tract. When the stone moves, the ureter goes into spasm around the stone. The stone is usually passed in the urine.  CAUSES   A disorder that makes certain neck glands produce too much parathyroid hormone (primary hyperparathyroidism).  A buildup of uric acid crystals, similar to gout in your joints.  Narrowing (stricture) of the ureter.  A kidney obstruction present at birth (congenital obstruction).  Previous surgery on the kidney or ureters.  Numerous kidney infections. SYMPTOMS   Feeling sick to your stomach (nauseous).  Throwing up (vomiting).  Blood in the urine (hematuria).  Pain that usually spreads (radiates) to the groin.  Frequency or urgency of urination. DIAGNOSIS   Taking a history and physical exam.  Blood or urine tests.  CT scan.  Occasionally, an examination of the inside of the urinary bladder (cystoscopy) is performed. TREATMENT   Observation.  Increasing your fluid intake.  Extracorporeal shock wave lithotripsy--This is a noninvasive procedure that uses shock waves to break up kidney stones.  Surgery may be needed if you have severe pain or persistent obstruction. There are various surgical procedures. Most of the procedures are performed with the use of small instruments. Only small incisions are  needed to accommodate these instruments, so recovery time is minimized. The size, location, and chemical composition are all important variables that will determine the proper choice of action for you. Talk to your health care provider to better understand your situation so that you will minimize the risk of injury to yourself and your kidney.  HOME CARE INSTRUCTIONS   Drink enough water and  fluids to keep your urine clear or pale yellow. This will help you to pass the stone or stone fragments.  Strain all urine through the provided strainer. Keep all particulate matter and stones for your health care provider to see. The stone causing the pain may be as small as a grain of salt. It is very important to use the strainer each and every time you pass your urine. The collection of your stone will allow your health care provider to analyze it and verify that a stone has actually passed. The stone analysis will often identify what you can do to reduce the incidence of recurrences.  Only take over-the-counter or prescription medicines for pain, discomfort, or fever as directed by your health care provider.  Keep all follow-up visits as told by your health care provider. This is important.  Get follow-up X-rays if required. The absence of pain does not always mean that the stone has passed. It may have only stopped moving. If the urine remains completely obstructed, it can cause loss of kidney function or even complete destruction of the kidney. It is your responsibility to make sure X-rays and follow-ups are completed. Ultrasounds of the kidney can show blockages and the status of the kidney. Ultrasounds are not associated with any radiation and can be performed easily in a matter of minutes.  Make changes to your daily diet as told by your health care provider. You may be told to:  Limit the amount of salt that you eat.  Eat 5 or more servings of fruits and vegetables each day.  Limit the amount of meat, poultry, fish, and eggs that you eat.  Collect a 24-hour urine sample as told by your health care provider.You may need to collect another urine sample every 6-12 months. SEEK MEDICAL CARE IF:  You experience pain that is progressive and unresponsive to any pain medicine you have been prescribed. SEEK IMMEDIATE MEDICAL CARE IF:   Pain cannot be controlled with the prescribed  medicine.  You have a fever or shaking chills.  The severity or intensity of pain increases over 18 hours and is not relieved by pain medicine.  You develop a new onset of abdominal pain.  You feel faint or pass out.  You are unable to urinate.   This information is not intended to replace advice given to you by your health care provider. Make sure you discuss any questions you have with your health care provider.   Document Released: 10/05/2005 Document Revised: 06/26/2015 Document Reviewed: 03/08/2013 Elsevier Interactive Patient Education 2016 Elsevier Inc.  Renal Colic Renal colic is pain that is caused by passing a kidney stone. The pain can be sharp and severe. It may be felt in the back, abdomen, side (flank), or groin. It can cause nausea. Renal colic can come and go. HOME CARE INSTRUCTIONS Watch your condition for any changes. The following actions may help to lessen any discomfort that you are feeling:  Take medicines only as directed by your health care provider.  Ask your health care provider if it is okay to take over-the-counter pain medicine.  Drink  enough fluid to keep your urine clear or pale yellow. Drink 6-8 glasses of water each day.  Limit the amount of salt that you eat to less than 2 grams per day.  Reduce the amount of protein in your diet. Eat less meat, fish, nuts, and dairy.  Avoid foods such as spinach, rhubarb, nuts, or bran. These may make kidney stones more likely to form. SEEK MEDICAL CARE IF:  You have a fever or chills.  Your urine smells bad or looks cloudy.  You have pain or burning when you pass urine. SEEK IMMEDIATE MEDICAL CARE IF:  Your flank pain or groin pain suddenly worsens.  You become confused or disoriented or you lose consciousness.   This information is not intended to replace advice given to you by your health care provider. Make sure you discuss any questions you have with your health care provider.   Document  Released: 07/15/2005 Document Revised: 10/26/2014 Document Reviewed: 08/15/2014 Elsevier Interactive Patient Education [employer] Elsevier Inc.

## 2016-04-09 NOTE — Telephone Encounter (Signed)
Please schedule L URS, LL, stent for early next week as possible.    Call patient with time and date of the procedure.    I will fill out a booking sheet in the office later today.  He does not need to be seen again in the office prior to the procedure.  He will probably only need phone interview given his age and relative health.  Vanna ScotlandAshley Lettie Czarnecki, MD

## 2016-04-09 NOTE — ED Provider Notes (Signed)
[facility] Emergency Department Provider Note   ____________________________________________  Time seen: Approximately 257 AM  I have reviewed the triage vital signs and the nursing notes.   HISTORY  Chief Complaint Flank Pain    HPI Roderick PeeRodney Terwilliger is a 30 y.o. male who comes into the hospital today with a kidney stone. The patient reports that he saw his general physician and they did an ultrasound. He was found to have multiple kidney stones in his left kidneys. He reports that this was done around the second week of June. He reports he's had some pain prior to the ultrasound. He was given some ciprofloxacin, Flomax and Vicodin for pain. He reports that around yesterday at 5 PM the pain built up and it has been intense since then. He reports he took Vicodin did not help. He reports that he vomited multiple times and he said some constant left flank pain. He reports that it goes a little bit to his side but it is constant in his back. The patient rates pain 8 out of 10 in intensity. He has never had kidney stones before. The patient reports he could not tolerate the pain anymore so he decided to come in to the hospital for evaluation.   History reviewed. No pertinent past medical history.  Patient Active Problem List   Diagnosis Date Noted  . Decreased libido 05/20/2015  . Acid reflux 05/20/2015    Past Surgical History  Procedure Laterality Date  . No past surgeries      Current Outpatient Rx  Name  Route  Sig  Dispense  Refill  . HYDROcodone-acetaminophen (NORCO/VICODIN) 5-325 MG tablet   Oral   Take 1 tablet by mouth every 6 (six) hours as needed for moderate pain.   30 tablet   0   . ranitidine (ZANTAC) 75 MG tablet   Oral   Take 1 tablet by mouth as needed.         . tamsulosin (FLOMAX) 0.4 MG CAPS capsule   Oral   Take 1 capsule (0.4 mg total) by mouth daily.   30 capsule   3   . ciprofloxacin (CIPRO) 500 MG tablet   Oral   Take  1 tablet (500 mg total) by mouth 2 (two) times daily. Patient not taking: Reported on 04/09/2016   20 tablet   0   . ondansetron (ZOFRAN ODT) 4 MG disintegrating tablet   Oral   Take 1 tablet (4 mg total) by mouth every 8 (eight) hours as needed for nausea or vomiting.   20 tablet   0   . oxyCODONE-acetaminophen (ROXICET) 5-325 MG tablet   Oral   Take 1 tablet by mouth every 6 (six) hours as needed.   12 tablet   0     Allergies Review of patient's allergies indicates no known allergies.  Family History  Problem Relation Age of Onset  . Healthy Mother   . Diabetes Father   . Healthy Sister   . Healthy Brother   . Lung cancer Paternal Grandfather   . Liver cancer Paternal Grandfather   . Healthy Sister   . Healthy Brother   . Healthy Brother     Social History Social History  Substance Use Topics  . Smoking status: Former Games developermoker  . Smokeless tobacco: Never Used     Comment: quit in 2004  . Alcohol Use: 0.0 oz/week    0 Standard drinks or equivalent per week     Comment: OCCASIONALLY  Review of Systems Constitutional: No fever/chills Eyes: No visual changes. ENT: No sore throat. Cardiovascular: Denies chest pain. Respiratory: Denies shortness of breath. Gastrointestinal: No abdominal pain.  No nausea, no vomiting.  No diarrhea.  No constipation. Genitourinary: Negative for dysuria. Musculoskeletal: Left flank pain Skin: Negative for rash. Neurological: Negative for headaches, focal weakness or numbness.  10-point ROS otherwise negative.  ____________________________________________   PHYSICAL EXAM:  VITAL SIGNS: ED Triage Vitals  Enc Vitals Group     BP 04/09/16 0221 119/67 mmHg     Pulse Rate 04/09/16 0221 84     Resp 04/09/16 0221 22     Temp 04/09/16 0221 98 F (36.7 C)     Temp Source 04/09/16 0221 Oral     SpO2 04/09/16 0221 97 %     Weight 04/09/16 0221 243 lb (110.224 kg)     Height 04/09/16 0221 6' (1.829 m)     Head Cir --       Peak Flow --      Pain Score 04/09/16 0216 10     Pain Loc --      Pain Edu? --      Excl. in GC? --     Constitutional: Alert and oriented. Well appearing and in Moderate distress. Eyes: Conjunctivae are normal. PERRL. EOMI. Head: Atraumatic. Nose: No congestion/rhinnorhea. Mouth/Throat: Mucous membranes are moist.  Oropharynx non-erythematous. Cardiovascular: Normal rate, regular rhythm. Grossly normal heart sounds.  Good peripheral circulation. Respiratory: Normal respiratory effort.  No retractions. Lungs CTAB. Gastrointestinal: Soft with left lower quadrant tenderness palpation. No distention. Left CVA tenderness to palpation Musculoskeletal: No lower extremity tenderness nor edema.   Neurologic:  Normal speech and language.  Skin:  Skin is warm, dry and intact. Marland Kitchen Psychiatric: Mood and affect are normal.   ____________________________________________   LABS (all labs ordered are listed, but only abnormal results are displayed)  Labs Reviewed  CBC - Abnormal; Notable for the following:    WBC 11.3 (*)    All other components within normal limits  BASIC METABOLIC PANEL - Abnormal; Notable for the following:    Glucose, Bld 125 (*)    BUN 21 (*)    Creatinine, Ser 1.62 (*)    GFR calc non Af Amer 55 (*)    All other components within normal limits  URINALYSIS COMPLETEWITH MICROSCOPIC ([facility] ONLY) - Abnormal; Notable for the following:    Color, Urine YELLOW (*)    APPearance CLEAR (*)    Ketones, ur 1+ (*)    Hgb urine dipstick 2+ (*)    All other components within normal limits   ____________________________________________  EKG  None ____________________________________________  RADIOLOGY  CT renal stone study: Mild left-sided hydronephrosis with obstructing 6 x 5 mm stone at the left mid ureter approximately 10 cm proximal to the left UVJ, fatty infiltration on the wall of the distal ileum reflect sequela of chronic  inflammation. ____________________________________________   PROCEDURES  Procedure(s) performed: None  Critical Care performed: No  ____________________________________________   INITIAL IMPRESSION / ASSESSMENT AND PLAN / ED COURSE  Pertinent labs & imaging results that were available during my care of the patient were reviewed by me and considered in my medical decision making (see chart for details).  This is a 30 year old male who comes into the hospital today with some left flank pain. The patient was diagnosed with kidney stones approximately 2 weeks ago. The patient is continuing to have pain and looks like he continues to have a kidney  stone. I did give the patient dose of morphine and I will give him a second dose of morphine. I will await the results of the urinalysis and then contact urology. His have a mildly elevated creatinine as well.  The patient was seen by Dr. Apolinar JunesBrandon in the emergency department. She reports that if he gets a KUB and she is able to see a stone then she can perform a lithotripsy. Dr. Charlsie QuestBrand and was unable to see the patient stone. At this time the patient will be discharged to home and scheduled for laser surgery. I discussed this with the patient. He received a few more doses of morphine and he'll be discharged home. He also received a dose of Percocet. ____________________________________________   FINAL CLINICAL IMPRESSION(S) / ED DIAGNOSES  Final diagnoses:  Flank pain  Renal colic      NEW MEDICATIONS STARTED DURING THIS VISIT:  New Prescriptions   ONDANSETRON (ZOFRAN ODT) 4 MG DISINTEGRATING TABLET    Take 1 tablet (4 mg total) by mouth every 8 (eight) hours as needed for nausea or vomiting.   OXYCODONE-ACETAMINOPHEN (ROXICET) 5-325 MG TABLET    Take 1 tablet by mouth every 6 (six) hours as needed.     Note:  This document was prepared using Dragon voice recognition software and may include unintentional dictation errors.    Rebecka ApleyAllison P  Webster, MD 04/09/16 [phone]

## 2016-04-10 ENCOUNTER — Telehealth: Payer: Self-pay | Admitting: Radiology

## 2016-04-10 DIAGNOSIS — N200 Calculus of kidney: Secondary | ICD-10-CM

## 2016-04-10 MED ORDER — HYDROCODONE-ACETAMINOPHEN 5-325 MG PO TABS: 1.0000 | ORAL_TABLET | Freq: Four times a day (QID) | ORAL | Status: DC | PRN

## 2016-04-10 NOTE — Telephone Encounter (Signed)
Spoke with pt and made him aware of medication script up front. Pt voiced understanding.

## 2016-04-10 NOTE — Telephone Encounter (Signed)
That's fine.  Have him come by and pick up script.    Troy ScotlandAshley Blasa Raisch, MD

## 2016-04-10 NOTE — Telephone Encounter (Signed)
Pt's wife called stating pt does not have enough pain medication to last until surgery on Monday & requests a refill. Pt was seen in the ER but hasn't been seen in our office yet. Please advise.

## 2016-04-13 ENCOUNTER — Ambulatory Visit: Payer: 59 | Admitting: Family Medicine

## 2016-04-13 ENCOUNTER — Ambulatory Visit: Payer: Commercial Managed Care - HMO | Admitting: Anesthesiology

## 2016-04-13 ENCOUNTER — Ambulatory Visit
Admission: RE | Admit: 2016-04-13 | Discharge: 2016-04-13 | Disposition: A | Discharge status: Home / Self Care | Payer: Commercial Managed Care - HMO | Source: Ambulatory Visit | Attending: Urology | Admitting: Urology

## 2016-04-13 ENCOUNTER — Encounter: Payer: Self-pay | Admitting: *Deleted

## 2016-04-13 ENCOUNTER — Encounter
Admission: RE | Disposition: A | Discharge status: Home / Self Care | Payer: Self-pay | Source: Ambulatory Visit | Attending: Urology

## 2016-04-13 DIAGNOSIS — K219 Gastro-esophageal reflux disease without esophagitis: Secondary | ICD-10-CM | POA: Insufficient documentation

## 2016-04-13 DIAGNOSIS — Z87891 Personal history of nicotine dependence: Secondary | ICD-10-CM | POA: Diagnosis not present

## 2016-04-13 DIAGNOSIS — N201 Calculus of ureter: Secondary | ICD-10-CM | POA: Diagnosis not present

## 2016-04-13 DIAGNOSIS — N132 Hydronephrosis with renal and ureteral calculous obstruction: Secondary | ICD-10-CM | POA: Diagnosis not present

## 2016-04-13 DIAGNOSIS — N200 Calculus of kidney: Secondary | ICD-10-CM

## 2016-04-13 HISTORY — DX: Gastro-esophageal reflux disease without esophagitis: K21.9

## 2016-04-13 HISTORY — PX: CYSTOSCOPY/URETEROSCOPY/HOLMIUM LASER/STENT PLACEMENT: SHX6546

## 2016-04-13 SURGERY — CYSTOSCOPY/URETEROSCOPY/HOLMIUM LASER/STENT PLACEMENT
Anesthesia: General | Laterality: Left

## 2016-04-13 MED ORDER — PROPOFOL 10 MG/ML IV BOLUS
INTRAVENOUS | Status: DC | PRN
  Administered 2016-04-13: 160 mg via INTRAVENOUS

## 2016-04-13 MED ORDER — MIDAZOLAM HCL 2 MG/2ML IJ SOLN
INTRAMUSCULAR | Status: DC | PRN
  Administered 2016-04-13: 2 mg via INTRAVENOUS

## 2016-04-13 MED ORDER — IOTHALAMATE MEGLUMINE 43 % IV SOLN
INTRAVENOUS | Status: DC | PRN
  Administered 2016-04-13: 15 mL via URETHRAL

## 2016-04-13 MED ORDER — CEFAZOLIN SODIUM-DEXTROSE 2-4 GM/100ML-% IV SOLN
INTRAVENOUS | Status: DC
Start: 2016-04-13 — End: 2016-04-13
  Filled 2016-04-13: qty 100

## 2016-04-13 MED ORDER — FENTANYL CITRATE (PF) 100 MCG/2ML IJ SOLN
INTRAMUSCULAR | Status: AC
  Administered 2016-04-13: 25 ug via INTRAVENOUS
  Filled 2016-04-13: qty 2

## 2016-04-13 MED ORDER — ONDANSETRON HCL 4 MG/2ML IJ SOLN
INTRAMUSCULAR | Status: AC
  Filled 2016-04-13: qty 2

## 2016-04-13 MED ORDER — ONDANSETRON HCL 4 MG/2ML IJ SOLN
4.0000 mg | Freq: Once | INTRAMUSCULAR | Status: AC | PRN
  Administered 2016-04-13: 4 mg via INTRAVENOUS

## 2016-04-13 MED ORDER — ONDANSETRON HCL 4 MG/2ML IJ SOLN
INTRAMUSCULAR | Status: DC | PRN
  Administered 2016-04-13: 4 mg via INTRAVENOUS

## 2016-04-13 MED ORDER — DEXTROSE 5 % IV SOLN: 2000.0000 mg | Freq: Once | INTRAVENOUS | Status: DC

## 2016-04-13 MED ORDER — LACTATED RINGERS IV SOLN
INTRAVENOUS | Status: DC | PRN
Start: 2016-04-13 — End: 2016-04-13
  Administered 2016-04-13 (×2): via INTRAVENOUS

## 2016-04-13 MED ORDER — CEFAZOLIN SODIUM-DEXTROSE 2-4 GM/100ML-% IV SOLN
2.0000 g | Freq: Once | INTRAVENOUS | Status: AC
  Administered 2016-04-13: 2 g via INTRAVENOUS

## 2016-04-13 MED ORDER — FENTANYL CITRATE (PF) 100 MCG/2ML IJ SOLN
INTRAMUSCULAR | Status: DC | PRN
  Administered 2016-04-13: 25 ug via INTRAVENOUS
  Administered 2016-04-13: 50 ug via INTRAVENOUS
  Administered 2016-04-13: 25 ug via INTRAVENOUS

## 2016-04-13 MED ORDER — FAMOTIDINE 20 MG PO TABS
ORAL_TABLET | ORAL | Status: AC
  Filled 2016-04-13: qty 1

## 2016-04-13 MED ORDER — LACTATED RINGERS IV SOLN
INTRAVENOUS | Status: DC
  Administered 2016-04-13: 10:00:00 via INTRAVENOUS

## 2016-04-13 MED ORDER — FENTANYL CITRATE (PF) 100 MCG/2ML IJ SOLN
25.0000 ug | INTRAMUSCULAR | Status: AC | PRN
  Administered 2016-04-13 (×6): 25 ug via INTRAVENOUS

## 2016-04-13 SURGICAL SUPPLY — 32 items
BAG DRAIN CYSTO-URO LG1000N (MISCELLANEOUS) ×3 IMPLANT
BASKET ZERO TIP 1.9FR (BASKET) ×3 IMPLANT
CATH URETL 5X70 OPEN END (CATHETERS) ×3 IMPLANT
CNTNR SPEC 2.5X3XGRAD LEK (MISCELLANEOUS) ×1
CONRAY 43 FOR UROLOGY 50M (MISCELLANEOUS) ×3 IMPLANT
CONT SPEC 4OZ STER OR WHT (MISCELLANEOUS) ×2
CONTAINER SPEC 2.5X3XGRAD LEK (MISCELLANEOUS) ×1 IMPLANT
DRSG TEGADERM 2X2.25 PEDS (GAUZE/BANDAGES/DRESSINGS) ×3 IMPLANT
FEE TECHNICIAN ONLY PER HOUR (MISCELLANEOUS) ×3 IMPLANT
GLOVE BIO SURGEON STRL SZ7 (GLOVE) ×6 IMPLANT
GLOVE BIO SURGEON STRL SZ7.5 (GLOVE) ×3 IMPLANT
GOWN STRL REUS W/ TWL LRG LVL3 (GOWN DISPOSABLE) ×2 IMPLANT
GOWN STRL REUS W/ TWL XL LVL3 (GOWN DISPOSABLE) ×1 IMPLANT
GOWN STRL REUS W/TWL LRG LVL3 (GOWN DISPOSABLE) ×4
GOWN STRL REUS W/TWL XL LVL3 (GOWN DISPOSABLE) ×2
KIT RM TURNOVER CYSTO AR (KITS) ×3 IMPLANT
LASER FIBER 200M SMARTSCOPE (Laser) ×3 IMPLANT
LASER HOLMIUM FIBER SU 272UM (MISCELLANEOUS) ×3 IMPLANT
PACK CYSTO AR (MISCELLANEOUS) ×3 IMPLANT
PREP PVP WINGED SPONGE (MISCELLANEOUS) ×3 IMPLANT
SENSORWIRE 0.038 NOT ANGLED (WIRE) ×3
SET CYSTO W/LG BORE CLAMP LF (SET/KITS/TRAYS/PACK) ×3 IMPLANT
SOL .9 NS 3000ML IRR  AL (IV SOLUTION) ×2
SOL .9 NS 3000ML IRR UROMATIC (IV SOLUTION) ×1 IMPLANT
STENT URET 6FRX24 CONTOUR (STENTS) ×3 IMPLANT
STENT URET 6FRX26 CONTOUR (STENTS) ×3 IMPLANT
STENT URET 6FRX28 CONTOUR (STENTS) ×3 IMPLANT
SURGILUBE 2OZ TUBE FLIPTOP (MISCELLANEOUS) ×3 IMPLANT
SWABSTK COMLB BENZOIN TINCTURE (MISCELLANEOUS) ×3 IMPLANT
WATER STERILE IRR 1000ML POUR (IV SOLUTION) ×3 IMPLANT
WIRE SENSOR 0.038 NOT ANGLED (WIRE) ×1 IMPLANT
ZERO TIP BASKET ×3 IMPLANT

## 2016-04-13 NOTE — Op Note (Signed)
Preoperative diagnosis: left ureteral calculus  Postoperative diagnosis: left ureteral calculus  Procedure:  1. Cystoscopy 2. left ureteroscopy and stone removal 3. Ureteroscopic laser lithotripsy 4. left 60F x 28 ureteral stent placement  5. left retrograde pyelography with interpretation  Surgeon: Crist FatBenjamin W. Herrick, MD  Anesthesia: General  Complications: None  Intraoperative findings: left retrograde pyelography demonstrated a filling defect within the left ureter consistent with the patient's known calculus without other abnormalities.  Ureter was very tight and required the 4.5 F semi-rigid scope.  EBL: Minimal  Specimens: 1. left ureteral calculus  Indication: Roderick PeeRodney Delap is a 30 y.o.   patient with urolithiasis. After reviewing the management options for treatment, the patient elected to proceed with the above surgical procedure(s). We have discussed the potential benefits and risks of the procedure, side effects of the proposed treatment, the likelihood of the patient achieving the goals of the procedure, and any potential problems that might occur during the procedure or recuperation. Informed consent has been obtained.  Description of procedure:  The patient was taken to the operating room and general anesthesia was induced.  The patient was placed in the dorsal lithotomy position, prepped and draped in the usual sterile fashion, and preoperative antibiotics were administered. A preoperative time-out was performed.   Cystourethroscopy was performed.  The patient's urethra was examined and was normal. The bladder was then systematically examined in its entirety. There was no evidence for any bladder tumors, stones, or other mucosal pathology.    Attention then turned to the left ureteral orifice and a ureteral catheter was used to intubate the ureteral orifice.  Omnipaque contrast was injected through the ureteral catheter and a retrograde pyelogram was performed with  findings as dictated above.  A 0.38 sensor guidewire was then advanced up the left ureter into the renal pelvis under fluoroscopic guidance. The 4.5 Fr semirigid ureteroscope was then advanced into the ureter next to the guidewire and the calculus was identified.   The stone was then fragmented with the 365 micron holmium laser fiber on a setting of 0.6 and frequency of 6 Hz.   All stones were then removed from the ureter with an zero-tipped nitinol basket.  Reinspection of the ureter revealed no remaining visible stones or fragments.   The wire was then backloaded through the cystoscope and a ureteral stent was advance over the wire using Seldinger technique.  The stent was positioned appropriately under fluoroscopic and cystoscopic guidance.  The wire was then removed with an adequate stent curl noted in the renal pelvis as well as in the bladder.  The bladder was then emptied and the procedure ended.  The patient appeared to tolerate the procedure well and without complications.  The patient was able to be awakened and transferred to the recovery unit in satisfactory condition.   Disposition: The patient will be scheduled for stent removal in 5-7 days in our clinic.

## 2016-04-13 NOTE — Anesthesia Preprocedure Evaluation (Signed)
Anesthesia Evaluation  Patient identified by MRN, date of birth, ID band Patient awake    Reviewed: Allergy & Precautions, NPO status   Airway Mallampati: II       Dental  (+) Chipped, Dental Advisory Given   Pulmonary neg pulmonary ROS, former smoker,    breath sounds clear to auscultation       Cardiovascular Exercise Tolerance: Good  Rhythm:Regular     Neuro/Psych negative neurological ROS     GI/Hepatic Neg liver ROS, GERD  Medicated,  Endo/Other  negative endocrine ROS  Renal/GU negative Renal ROS     Musculoskeletal   Abdominal Normal abdominal exam  (+)   Peds  Hematology   Anesthesia Other Findings   Reproductive/Obstetrics                             Anesthesia Physical Anesthesia Plan  ASA: II  Anesthesia Plan: General   Post-op Pain Management:    Induction: Intravenous  Airway Management Planned: LMA and Oral ETT  Additional Equipment:   Intra-op Plan:   Post-operative Plan: Extubation in OR  Informed Consent: I have reviewed the patients History and Physical, chart, labs and discussed the procedure including the risks, benefits and alternatives for the proposed anesthesia with the patient or authorized representative who has indicated his/her understanding and acceptance.     Plan Discussed with: CRNA  Anesthesia Plan Comments:         Anesthesia Quick Evaluation

## 2016-04-13 NOTE — Anesthesia Procedure Notes (Signed)
Procedure Name: LMA Insertion Date/Time: 04/13/2016 10:22 AM Performed by: [id number], Shanise Balch Pre-anesthesia Checklist: Timeout performed, Patient being monitored, Suction available, Emergency Drugs available and Patient identified Patient Re-evaluated:Patient Re-evaluated prior to inductionOxygen Delivery Method: Circle system utilized Preoxygenation: Pre-oxygenation with 100% oxygen Intubation Type: IV induction LMA: LMA inserted LMA Size: 4.5 Number of attempts: 1 Placement Confirmation: breath sounds checked- equal and bilateral,  CO2 detector and positive ETCO2 Tube secured with: Tape

## 2016-04-13 NOTE — Transfer of Care (Signed)
Immediate Anesthesia Transfer of Care Note  Patient: Troy Sawyer  Procedure(s) Performed: Procedure(s): CYSTOSCOPY/URETEROSCOPY/HOLMIUM LASER/STENT PLACEMENT (Left)  Patient Location: PACU  Anesthesia Type:General  Level of Consciousness: awake, alert  and oriented  Airway & Oxygen Therapy: Patient Spontanous Breathing and Patient connected to face mask oxygen  Post-op Assessment: Report given to RN  Post vital signs: Reviewed and stable  Last Vitals:  Filed Vitals:   04/13/16 0757 04/13/16 1136  BP: 144/92 122/72  Pulse: 92 68  Temp: 37.1 C 36.8 C  Resp: 16 16    Last Pain:  Filed Vitals:   04/13/16 1136  PainSc: 3          Complications: No apparent anesthesia complications

## 2016-04-13 NOTE — H&P (View-Only) (Signed)
Urology Consult  I have been asked to see the patient by Dr. Zenda AlpersWebster, for evaluation and management of left ureteral stone.  Chief Complaint: left flank pain  History of Present Illness: Troy PeeRodney Sawyer is a 30 y.o. year old who presented to the emergency room today with acute onset left flank pain. This has intermittently occurring from at least 3 weeks with episodes of severe pain with associated nausea and vomiting.  The pain is primarily in his left lower quadrant radiating down to his left groin.  This morning, the pain was so severe that it brought him to the emergency room.  He has been taking Flomax and pain meds prescribed by his PCP, Dr. Dossie Arbourrissman.  He denies any fevers or chills. No leukocytosis. UA is unremarkable. His creatinine is mildly elevated to 1.5 although he is quite muscular. Baseline creatinine unknown.  CT scan shows a 6 mm left mid/distal ureteral stone around the level of the iliacs. There is proximal hydroureteronephrosis. There are no upper tract nonobstructing nephrolithiasis bilaterally.  No previous history of kidney stones.  History reviewed. No pertinent past medical history.   (GERD)  Past Surgical History  Procedure Laterality Date  . No past surgeries      Home Medications:    Medication List    TAKE these medications        ondansetron 4 MG disintegrating tablet  Commonly known as:  ZOFRAN ODT  Take 1 tablet (4 mg total) by mouth every 8 (eight) hours as needed for nausea or vomiting.     oxyCODONE-acetaminophen 5-325 MG tablet  Commonly known as:  ROXICET  Take 1 tablet by mouth every 6 (six) hours as needed.      ASK your doctor about these medications        ciprofloxacin 500 MG tablet  Commonly known as:  CIPRO  Take 1 tablet (500 mg total) by mouth 2 (two) times daily.     HYDROcodone-acetaminophen 5-325 MG tablet  Commonly known as:  NORCO/VICODIN  Take 1 tablet by mouth every 6 (six) hours as needed for moderate pain.     ranitidine 75 MG tablet  Commonly known as:  ZANTAC  Take 1 tablet by mouth as needed.     tamsulosin 0.4 MG Caps capsule  Commonly known as:  FLOMAX  Take 1 capsule (0.4 mg total) by mouth daily.        Allergies: No Known Allergies  Family History  Problem Relation Age of Onset  . Healthy Mother   . Diabetes Father   . Healthy Sister   . Healthy Brother   . Lung cancer Paternal Grandfather   . Liver cancer Paternal Grandfather   . Healthy Sister   . Healthy Brother   . Healthy Brother     Social History:  reports that he has quit smoking. He has never used smokeless tobacco. He reports that he drinks alcohol. He reports that he does not use illicit drugs.  ROS: A complete review of systems was performed.  All systems are negative except for pertinent findings as noted.  Physical Exam:  Vital signs in last 24 hours: Temp:  [98 F (36.7 C)] 98 F (36.7 C) (06/22 0221) Pulse Rate:  [65-84] 65 (06/22 0630) Resp:  [20-22] 20 (06/22 0251) BP: (102-127)/(59-87) 106/59 mmHg (06/22 0630) SpO2:  [92 %-98 %] 97 % (06/22 0630) Weight:  [243 lb (110.224 kg)] 243 lb (110.224 kg) (06/22 0221) Constitutional:  Alert and oriented, No acute  distress HEENT:  AT, moist mucus membranes.  Trachea midline, no masses Cardiovascular: Regular rate and rhythm, no clubbing, cyanosis, or edema. Respiratory: Normal respiratory effort, lungs clear bilaterally GI: Abdomen is soft, nondistended, no abdominal masses.  Mild LLQ pain.   GU: No CVA tenderness Skin: No rashes, bruises or suspicious lesions Lymph: No cervical adenopathy Neurologic: Grossly intact, no focal deficits, moving all 4 extremities Psychiatric: Normal mood and affect   Laboratory Data:   Recent Labs  04/09/16 0226  WBC 11.3*  HGB 14.0  HCT 40.5    Recent Labs  04/09/16 0226  NA 139  K 4.1  CL 104  CO2 24  GLUCOSE 125*  BUN 21*  CREATININE 1.62*  CALCIUM 9.4   No results for input(s): LABPT, INR in the  last 72 hours. No results for input(s): LABURIN in the last 72 hours. Results for orders placed or performed in visit on 03/24/16  Urine culture     Status: None   Collection Time: 03/24/16  9:30 AM  Result Value Ref Range Status   Urine Culture, Routine Final report  Final   Urine Culture result 1 No growth  Final     Component     Latest Ref Rng 04/09/2016  Color, Urine     YELLOW YELLOW (A)  Appearance     CLEAR CLEAR (A)  Glucose     NEGATIVE mg/dL NEGATIVE  Bilirubin Urine     NEGATIVE NEGATIVE  Ketones, ur     NEGATIVE mg/dL 1+ (A)  Specific Gravity, Urine     1.005 - 1.030 1.021  Hgb urine dipstick     NEGATIVE 2+ (A)  pH     5.0 - 8.0 6.0  Protein     NEGATIVE mg/dL NEGATIVE  Nitrite     NEGATIVE NEGATIVE  Leukocytes, UA     NEGATIVE NEGATIVE  RBC / HPF     0 - 5 RBC/hpf 6-30  WBC, UA     0 - 5 WBC/hpf 0-5  Bacteria, UA     NONE SEEN NONE SEEN  Squamous Epithelial / LPF     NONE SEEN NONE SEEN  Mucous      PRESENT     Radiologic Imaging: Dg Abd 1 View  04/09/2016  CLINICAL DATA:  Acute left-sided abdominal pain. EXAM: ABDOMEN - 1 VIEW COMPARISON:  CT scan of same day. FINDINGS: The bowel gas pattern is normal. The distal left ureteral calculus noted on prior CT scan is not well-visualized, potentially because it may be overlying the sacrum. No definite renal calculi are noted. IMPRESSION: No evidence of bowel obstruction or ileus. Distal left ureteral calculus noted on prior CT scan is not well visualized on this radiograph, potentially because it may be overlying the sacrum. Electronically Signed   By: Lupita Raider, M.D.   On: 04/09/2016 08:17   Ct Renal Stone Study  04/09/2016  CLINICAL DATA:  Acute onset of left-sided flank pain, nausea and vomiting. Initial encounter. EXAM: CT ABDOMEN AND PELVIS WITHOUT CONTRAST TECHNIQUE: Multidetector CT imaging of the abdomen and pelvis was performed following the standard protocol without IV contrast. COMPARISON:   Renal ultrasound performed 03/26/2016 FINDINGS: The visualized lung bases are clear. The liver and spleen are unremarkable in appearance. The gallbladder is within normal limits. The pancreas and adrenal glands are unremarkable. Mild left-sided hydronephrosis is noted, with left-sided perinephric stranding, and prominence of the left ureter to the level of an obstructing 6 x 5 mm stone  at the mid left ureter, approximately 10 cm proximal to the left vesicoureteral junction. The right kidney is unremarkable in appearance. No nonobstructing renal stones are identified. No free fluid is identified. There is fatty infiltration of the wall of the distal ileum, which may reflect sequelae of chronic inflammation. The remainder of the small bowel is unremarkable. The stomach is within normal limits. No acute vascular abnormalities are seen. The appendix is normal in caliber, without evidence of appendicitis. The colon is unremarkable in appearance. The bladder is mildly distended and grossly unremarkable. The prostate remains normal in size. No inguinal lymphadenopathy is seen. No acute osseous abnormalities are identified. IMPRESSION: 1. Mild left-sided hydronephrosis, with an obstructing 6 x 5 mm stone at the left mid ureter, approximately 10 cm proximal to the left vesicoureteral junction. 2. Fatty infiltration of the wall of the distal ileum may reflect sequelae of chronic inflammation. Small bowel otherwise unremarkable in appearance. Electronically Signed   By: Roanna RaiderJeffery  Chang M.D.   On: 04/09/2016 03:27   KUB- stone not well visualized   Impression/Assessment:  30 year old male with a 6 mm left mid/distal obstructing ureteral stone, mild acute kidney injury.  He is hemodynamically stable, afebrile, without evidence of infection. No acute intervention necessary at this time.  We discussed various treatment options including ESWL vs. ureteroscopy, laser lithotripsy, and stent vs. Continued medical expulsive  therapy. We discussed the risks and benefits of both including bleeding, infection, damage to surrounding structures, efficacy with need for possible further intervention, and need for temporary ureteral stent.  Given that the stone is difficult to visualize and KUB today likely secondary to the sacral bone, I feel that ureteroscopy may be the best option. Given that the stone has failed to pass in greater than 3 weeks, I do feel that it is likely time for intervention. This was communicated to the patient.   He is elected to proceed with left ureteroscopy, laser lithotripsy, left ureteral stent placement. We will arrange for him to be on the schedule for early next week for this procedure.    Warnings symptoms are reviewed and instructions return to the ER should he develop any urinary tract symptoms were fevers.  Plan:  -schedule L URS, LL, stent  -continue flomax, pain meds, strain urine until surgery  04/09/2016, 8:24 AM  Vanna ScotlandAshley Nikhita Mentzel,  MD

## 2016-04-13 NOTE — Interval H&P Note (Signed)
History and Physical Interval Note: Ok to proceed with left ureteroscopy/laser lithotripsy and stent placement. 04/13/2016 10:09 AM  Troy Sawyer  has presented today for surgery, with the diagnosis of LEFT URETERAL STONE  The various methods of treatment have been discussed with the patient and family. After consideration of risks, benefits and other options for treatment, the patient has consented to  Procedure(s): CYSTOSCOPY/URETEROSCOPY/HOLMIUM LASER/STENT PLACEMENT (Left) as a surgical intervention .  The patient's history has been reviewed, patient examined, no change in status, stable for surgery.  I have reviewed the patient's chart and labs.  Questions were answered to the patient's satisfaction.     Berniece SalinesHERRICK, Rani Idler W

## 2016-04-13 NOTE — Discharge Instructions (Signed)
DISCHARGE INSTRUCTIONS FOR KIDNEY STONE/URETERAL STENT   MEDICATIONS:  1.  Resume all your other meds from home - except do not take any extra narcotic pain meds that you may have at home.   ACTIVITY:  1. No strenuous activity x 1week  2. No driving while on narcotic pain medications  3. Drink plenty of water  4. Continue to walk at home - you can still get blood clots when you are at home, so keep active, but don't over do it.  5. May return to work/school tomorrow or when you feel ready   BATHING:  1. You can shower and we recommend daily showers    SIGNS/SYMPTOMS TO CALL:  Please call us if you have a fever greater than 101.5, uncontrolled nausea/vomiting, uncontrolled pain, dizziness, unable to urinate, bloody urine, chest pain, shortness of breath, leg swelling, leg pain, redness around wound, drainage from wound, or any other concerns or questions.   You can reach us at [phone].   FOLLOW-UP:  You have an appointment for stent removal in 1 week.   AMBULATORY SURGERY  DISCHARGE INSTRUCTIONS   1) The drugs that you were given will stay in your system until tomorrow so for the next 24 hours you should not:  A) Drive an automobile B) Make any legal decisions C) Drink any alcoholic beverage   2) You may resume regular meals tomorrow.  Today it is better to start with liquids and gradually work up to solid foods.  You may eat anything you prefer, but it is better to start with liquids, then soup and crackers, and gradually work up to solid foods.   3) Please notify your doctor immediately if you have any unusual bleeding, trouble breathing, redness and pain at the surgery site, drainage, fever, or pain not relieved by medication.    4) Additional Instructions:        Please contact your physician with any problems or Same Day Surgery at [phone], Monday through Friday 6 am to 4 pm, or [facility] at [facility] Main number at [phone].

## 2016-04-13 NOTE — Anesthesia Postprocedure Evaluation (Signed)
Anesthesia Post Note  Patient: Troy Sawyer  Procedure(s) Performed: Procedure(s) (LRB): CYSTOSCOPY/URETEROSCOPY/HOLMIUM LASER/STENT PLACEMENT (Left)  Patient location during evaluation: PACU Anesthesia Type: General Level of consciousness: awake Pain management: pain level controlled Vital Signs Assessment: post-procedure vital signs reviewed and stable Respiratory status: nonlabored ventilation Cardiovascular status: stable Anesthetic complications: no    Last Vitals:  Filed Vitals:   04/13/16 1201 04/13/16 1206  BP:  127/76  Pulse: 72 74  Temp:    Resp: 18 13    Last Pain:  Filed Vitals:   04/13/16 1210  PainSc: 6                  VAN STAVEREN,[name]

## 2016-04-16 ENCOUNTER — Telehealth: Payer: Self-pay | Admitting: Urology

## 2016-04-16 NOTE — Telephone Encounter (Signed)
Spoke with pt in reference to questions and going back to work. 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 Reinforced with pt that stent will not prevent him from working as far as B[employer] is concerned but that would be up to the police dept. Also reinforced with pt to drink plenty of fluids. Pt voiced understanding of whole conversation.

## 2016-04-16 NOTE — Telephone Encounter (Signed)
Patient had surgery with Dr. Marlou PorchHerrick on 6/26 and has questions for the nurse.

## 2016-04-20 ENCOUNTER — Other Ambulatory Visit: Payer: 59

## 2016-04-22 ENCOUNTER — Encounter: Payer: Self-pay | Admitting: Urology

## 2016-04-22 ENCOUNTER — Ambulatory Visit (INDEPENDENT_AMBULATORY_CARE_PROVIDER_SITE_OTHER): Payer: 59 | Admitting: Urology

## 2016-04-22 VITALS — BP 133/94 | HR 78 | Ht 72.0 in | Wt 241.0 lb

## 2016-04-22 DIAGNOSIS — N200 Calculus of kidney: Secondary | ICD-10-CM

## 2016-04-22 LAB — MICROSCOPIC EXAMINATION

## 2016-04-22 LAB — URINALYSIS, COMPLETE
BILIRUBIN UA: NEGATIVE
GLUCOSE, UA: NEGATIVE
Ketones, UA: NEGATIVE
Leukocytes, UA: NEGATIVE
NITRITE UA: NEGATIVE
PH UA: 5.5 (ref 5.0–7.5)
Specific Gravity, UA: 1.025 (ref 1.005–1.030)
UUROB: 0.2 mg/dL (ref 0.2–1.0)

## 2016-04-22 MED ORDER — LIDOCAINE HCL 2 % EX GEL
1.0000 "application " | Freq: Once | CUTANEOUS | Status: AC
  Administered 2016-04-22: 1 via URETHRAL

## 2016-04-22 MED ORDER — CIPROFLOXACIN HCL 500 MG PO TABS
500.0000 mg | ORAL_TABLET | Freq: Once | ORAL | Status: AC
  Administered 2016-04-22: 500 mg via ORAL

## 2016-04-22 NOTE — Progress Notes (Signed)
Patient returns following a cystoscopy with left ureteroscopy holmium laser lithotripsy and stent placement 04/13/2016. The urine culture in the past was negative. He's been well without fever tissue area. He presents today for stent removal.  NAD, looks well.  UA with few bacteria, >30 rbc, 0-5 wbc. Sent for Cx.   Procedure-cystoscopy with left ureteral stent removal-he was covered with Cipro. The cystoscope was passed per urethra. The urine was quite clear. The stent was grasped and removed without difficulty in its entirety. Patient tolerated the procedure well.  A/P - left ureteral stone - s/p left URS/HLL/stent 04/13/2016 with stent removal today. Plan renal u/s in 5 weeks - f/u to review.

## 2016-04-23 LAB — STONE ANALYSIS
Ca Oxalate,Dihydrate: 15 %
Ca Oxalate,Monohydr.: 82 %
Ca phos cry stone ql IR: 3 %
Stone Weight KSTONE: 5.8 mg

## 2016-04-24 LAB — URINE CULTURE: ORGANISM ID, BACTERIA: NO GROWTH

## 2016-05-26 ENCOUNTER — Telehealth: Payer: Self-pay | Admitting: Urology

## 2016-05-28 ENCOUNTER — Ambulatory Visit: Payer: 59

## 2016-05-28 ENCOUNTER — Ambulatory Visit
Admission: RE | Admit: 2016-05-28 | Discharge: 2016-05-28 | Disposition: A | Discharge status: Home / Self Care | Payer: 59 | Source: Ambulatory Visit | Attending: Urology | Admitting: Urology

## 2016-05-28 DIAGNOSIS — N200 Calculus of kidney: Secondary | ICD-10-CM | POA: Diagnosis present

## 2016-05-29 ENCOUNTER — Ambulatory Visit: Payer: 59

## 2016-06-03 ENCOUNTER — Ambulatory Visit: Payer: 59 | Admitting: Urology

## 2016-06-16 ENCOUNTER — Telehealth: Payer: Self-pay

## 2016-06-16 NOTE — Telephone Encounter (Signed)
Notify patient renal u/s appeared normal- Dr. Glori LuisEskridge  Spoke with pt in reference to RUS results. Pt voiced understanding.

## 2016-09-14 ENCOUNTER — Telehealth: Payer: Self-pay | Admitting: Urology

## 2016-09-14 NOTE — Telephone Encounter (Signed)
error 

## 2016-09-14 NOTE — Telephone Encounter (Signed)
done

## 2017-01-27 ENCOUNTER — Ambulatory Visit: Payer: 59 | Admitting: Urology

## 2017-01-27 NOTE — Progress Notes (Signed)
01/28/2017 3:06 PM   Thereasa Distance Gros 12-05-1985 [id number]  Referring provider: Tamsen Roers, PA [address] [place], [place] [address]  Chief Complaint  Patient presents with  . Flank Pain    6 weeks right side    HPI: Patient is a 31 year old Caucasian male with history of nephrolithiasis who presents today requesting an urgent appointment for symptoms reminiscent of a previous stone.  He states the pain is located in the right lower quadrant lower and radiates up into the right side of his back.  The pain comes and goes.  He describes it as feeling like he is being hit with a cyst. The pain is 7/10.  The pain is lasting  5 to 30 minutes at a time.  It does not wake him from sleep.  No trouble with urination no hematuria  No diarrhea.  No constipation.   Working makes the pain worse  Urination relieves the pain.    His UA today is unremarkable.  Patient underwent URS with Dr. Marlou Porch in June 2017 for a left ureteral stone.  No stone seen on the right on 2017 CT.  I independently reviewed the films.   PMH: Past Medical History:  Diagnosis Date  . GERD (gastroesophageal reflux disease)     Surgical History: Past Surgical History:  Procedure Laterality Date  . CYSTOSCOPY/URETEROSCOPY/HOLMIUM LASER/STENT PLACEMENT Left 04/13/2016   Procedure: CYSTOSCOPY/URETEROSCOPY/HOLMIUM LASER/STENT PLACEMENT;  Surgeon: Crist Fat, MD;  Location: [facility] ORS;  Service: Urology;  Laterality: Left;  . NO PAST SURGERIES      Home Medications:  Allergies as of 01/28/2017   No Known Allergies     Medication List       Accurate as of 01/28/17 11:59 PM. Always use your most recent med list.          ranitidine 75 MG tablet Commonly known as:  ZANTAC Take 75 mg by mouth as needed for heartburn.   tamsulosin 0.4 MG Caps capsule Commonly known as:  FLOMAX Take 1 capsule (0.4 mg total) by mouth daily.       Allergies: No Known Allergies  Family History: Family  History  Problem Relation Age of Onset  . Healthy Mother   . Diabetes Father   . Healthy Sister   . Healthy Brother   . Lung cancer Paternal Grandfather   . Liver cancer Paternal Grandfather   . Healthy Sister   . Healthy Brother   . Healthy Brother   . Prostate cancer Neg Hx   . Bladder Cancer Neg Hx   . Kidney cancer Neg Hx     Social History:  reports that he has quit smoking. He has never used smokeless tobacco. He reports that he drinks alcohol. He reports that he does not use drugs.  ROS: UROLOGY Frequent Urination?: No Hard to postpone urination?: No Burning/pain with urination?: No Get up at night to urinate?: No Leakage of urine?: No Urine stream starts and stops?: No Trouble starting stream?: No Do you have to strain to urinate?: No Blood in urine?: No Urinary tract infection?: No Sexually transmitted disease?: No Injury to kidneys or bladder?: No Painful intercourse?: No Weak stream?: No Erection problems?: No Penile pain?: No  Gastrointestinal Nausea?: No Vomiting?: No Indigestion/heartburn?: Yes Diarrhea?: No Constipation?: No  Constitutional Fever: No Night sweats?: No Weight loss?: No Fatigue?: No  Skin Skin rash/lesions?: No Itching?: No  Eyes Blurred vision?: No Double vision?: No  Ears/Nose/Throat Sore throat?: No Sinus problems?: No  Hematologic/Lymphatic Swollen glands?: No Easy bruising?: No  Cardiovascular Leg swelling?: No Chest pain?: No  Respiratory Cough?: No Shortness of breath?: No  Endocrine Excessive thirst?: No  Musculoskeletal Back pain?: Yes Joint pain?: No  Neurological Headaches?: No Dizziness?: No  Psychologic Depression?: No Anxiety?: No  Physical Exam: BP 116/70 (BP Location: Left Arm, Patient Position: Sitting, Cuff Size: Large)   Pulse 82   Ht 6' (1.829 m)   Wt 242 lb 6.4 oz (110 kg)   BMI 32.88 kg/m   Constitutional: Well nourished. Alert and oriented, No acute distress. HEENT:  [place] AT, moist mucus membranes. Trachea midline, no masses. Cardiovascular: No clubbing, cyanosis, or edema. Respiratory: Normal respiratory effort, no increased work of breathing. GI: Abdomen is soft, non tender, non distended, no abdominal masses. Liver and spleen not palpable.  No hernias appreciated.  Stool sample for occult testing is not indicated.   GU: Mild right CVA tenderness.  No bladder fullness or masses.   Skin: No rashes, bruises or suspicious lesions. Lymph: No cervical or inguinal adenopathy. Neurologic: Grossly intact, no focal deficits, moving all 4 extremities. Psychiatric: Normal mood and affect.  Laboratory Data: Lab Results  Component Value Date   WBC 11.3 (H) 04/09/2016   HGB 14.0 04/09/2016   HCT 40.5 04/09/2016   MCV 87.4 04/09/2016   PLT 211 04/09/2016    Lab Results  Component Value Date   CREATININE 1.62 (H) 04/09/2016    Lab Results  Component Value Date   AST 14 03/24/2016   Lab Results  Component Value Date   ALT 25 03/24/2016     Urinalysis Unremarkable.  See EPIC.    Pertinent imaging CLINICAL DATA:  Acute onset of left-sided flank pain, nausea and vomiting. Initial encounter.  EXAM: CT ABDOMEN AND PELVIS WITHOUT CONTRAST  TECHNIQUE: Multidetector CT imaging of the abdomen and pelvis was performed following the standard protocol without IV contrast.  COMPARISON:  Renal ultrasound performed 03/26/2016  FINDINGS: The visualized lung bases are clear.  The liver and spleen are unremarkable in appearance. The gallbladder is within normal limits. The pancreas and adrenal glands are unremarkable.  Mild left-sided hydronephrosis is noted, with left-sided perinephric stranding, and prominence of the left ureter to the level of an obstructing 6 x 5 mm stone at the mid left ureter, approximately 10 cm proximal to the left vesicoureteral junction.  The right kidney is unremarkable in appearance. No nonobstructing renal stones  are identified.  No free fluid is identified. There is fatty infiltration of the wall of the distal ileum, which may reflect sequelae of chronic inflammation. The remainder of the small bowel is unremarkable. The stomach is within normal limits. No acute vascular abnormalities are seen.  The appendix is normal in caliber, without evidence of appendicitis.  The colon is unremarkable in appearance.  The bladder is mildly distended and grossly unremarkable. The prostate remains normal in size. No inguinal lymphadenopathy is seen.  No acute osseous abnormalities are identified.  IMPRESSION: 1. Mild left-sided hydronephrosis, with an obstructing 6 x 5 mm stone at the left mid ureter, approximately 10 cm proximal to the left vesicoureteral junction. 2. Fatty infiltration of the wall of the distal ileum may reflect sequelae of chronic inflammation. Small bowel otherwise unremarkable in appearance.   Electronically Signed   By: Roanna Raider M.D.   On: 04/09/2016 03:27  Assessment & Plan:    1. Right flank pain  - obtain a CT Renal stone study  - Advised to contact  our office or seek treatment in the ED if becomes febrile or pain/ vomiting are difficult control in order to arrange for emergent/urgent intervention  2. History of nephrolithiasis  - no stone seen on the right on 2017 CT  - see above  Return for I will call patient with results.  These notes generated with voice recognition software. I apologize for typographical errors.  Michiel Cowboy, PA-C  [facility] [address] [place], [place] [address] [phone]

## 2017-01-28 ENCOUNTER — Ambulatory Visit: Payer: 59 | Admitting: Urology

## 2017-01-28 ENCOUNTER — Encounter: Payer: Self-pay | Admitting: Urology

## 2017-01-28 VITALS — BP 116/70 | HR 82 | Ht 72.0 in | Wt 242.4 lb

## 2017-01-28 DIAGNOSIS — R109 Unspecified abdominal pain: Secondary | ICD-10-CM | POA: Diagnosis not present

## 2017-01-28 DIAGNOSIS — N200 Calculus of kidney: Secondary | ICD-10-CM | POA: Diagnosis not present

## 2017-01-28 LAB — URINALYSIS, COMPLETE
Bilirubin, UA: NEGATIVE
GLUCOSE, UA: NEGATIVE
KETONES UA: NEGATIVE
Leukocytes, UA: NEGATIVE
NITRITE UA: NEGATIVE
PROTEIN UA: NEGATIVE
RBC UA: NEGATIVE
Specific Gravity, UA: 1.025 (ref 1.005–1.030)
Urobilinogen, Ur: 0.2 mg/dL (ref 0.2–1.0)
pH, UA: 6.5 (ref 5.0–7.5)

## 2017-01-28 LAB — MICROSCOPIC EXAMINATION
Bacteria, UA: NONE SEEN
Epithelial Cells (non renal): NONE SEEN /hpf (ref 0–10)
RBC MICROSCOPIC, UA: NONE SEEN /HPF (ref 0–?)

## 2017-02-03 ENCOUNTER — Telehealth: Payer: Self-pay | Admitting: Urology

## 2017-02-03 NOTE — Telephone Encounter (Signed)
Would you see if he has been contacted about scheduling his CT yet?

## 2017-02-03 NOTE — Telephone Encounter (Signed)
No not yet he was just seen on Thursday and I just got it approved by his insurance. They will call him in the next couple of days.  Troy Sawyer

## 2017-02-09 NOTE — Telephone Encounter (Signed)
I don't see where the CT has been scheduled for Troy Sawyer.  Would you check?

## 2017-02-10 ENCOUNTER — Telehealth: Payer: Self-pay | Admitting: Urology

## 2017-02-10 NOTE — Telephone Encounter (Signed)
The girl that works the [employer] was out of the office the last two days and she said she would call him today.  Thanks,  Marcelino Duster

## 2017-02-19 ENCOUNTER — Ambulatory Visit: Payer: 59

## 2017-02-19 ENCOUNTER — Ambulatory Visit
Admission: RE | Admit: 2017-02-19 | Discharge: 2017-02-19 | Disposition: A | Discharge status: Home / Self Care | Payer: Commercial Managed Care - HMO | Source: Ambulatory Visit | Attending: Urology | Admitting: Urology

## 2017-02-19 ENCOUNTER — Other Ambulatory Visit: Payer: 59

## 2017-02-19 DIAGNOSIS — R109 Unspecified abdominal pain: Secondary | ICD-10-CM

## 2017-02-22 ENCOUNTER — Telehealth: Payer: Self-pay

## 2017-02-22 NOTE — Telephone Encounter (Signed)
Spoke with pt in reference to no kidney stone. Pt voiced understanding.

## 2017-02-22 NOTE — Telephone Encounter (Signed)
-----   Message from Harle BattiestShannon A McGowan, PA-C sent at 02/19/2017  6:17 PM EDT ----- Please let Mr. Blank know that he does not have a kidney stone.

## 2017-09-03 ENCOUNTER — Telehealth: Payer: Self-pay | Admitting: Family Medicine

## 2017-09-03 NOTE — Telephone Encounter (Signed)
Pt is requesting a flu shot and a TDAP.  Pt wife is having a baby.  ZO#[phone]/[id number]/MW

## 2017-09-03 NOTE — Telephone Encounter (Signed)
Patient scheduled for 11/19.

## 2017-09-06 ENCOUNTER — Ambulatory Visit: Payer: 59 | Admitting: Family Medicine

## 2017-09-06 DIAGNOSIS — Z23 Encounter for immunization: Secondary | ICD-10-CM | POA: Diagnosis not present

## 2018-08-13 ENCOUNTER — Emergency Department
Admission: EM | Admit: 2018-08-13 | Discharge: 2018-08-14 | Disposition: A | Discharge status: Home / Self Care | Attending: Emergency Medicine | Admitting: Emergency Medicine

## 2018-08-13 ENCOUNTER — Emergency Department

## 2018-08-13 ENCOUNTER — Encounter: Payer: Self-pay | Admitting: Emergency Medicine

## 2018-08-13 DIAGNOSIS — Z87891 Personal history of nicotine dependence: Secondary | ICD-10-CM | POA: Diagnosis not present

## 2018-08-13 DIAGNOSIS — Y9389 Activity, other specified: Secondary | ICD-10-CM | POA: Insufficient documentation

## 2018-08-13 DIAGNOSIS — S39012A Strain of muscle, fascia and tendon of lower back, initial encounter: Secondary | ICD-10-CM | POA: Diagnosis not present

## 2018-08-13 DIAGNOSIS — Z79899 Other long term (current) drug therapy: Secondary | ICD-10-CM | POA: Insufficient documentation

## 2018-08-13 DIAGNOSIS — S3991XA Unspecified injury of abdomen, initial encounter: Secondary | ICD-10-CM

## 2018-08-13 DIAGNOSIS — S3981XA Other specified injuries of abdomen, initial encounter: Secondary | ICD-10-CM | POA: Diagnosis not present

## 2018-08-13 DIAGNOSIS — Y92410 Unspecified street and highway as the place of occurrence of the external cause: Secondary | ICD-10-CM | POA: Diagnosis not present

## 2018-08-13 DIAGNOSIS — S161XXA Strain of muscle, fascia and tendon at neck level, initial encounter: Secondary | ICD-10-CM | POA: Diagnosis not present

## 2018-08-13 DIAGNOSIS — S60222A Contusion of left hand, initial encounter: Secondary | ICD-10-CM

## 2018-08-13 DIAGNOSIS — Y990 Civilian activity done for income or pay: Secondary | ICD-10-CM | POA: Diagnosis not present

## 2018-08-13 DIAGNOSIS — S6992XA Unspecified injury of left wrist, hand and finger(s), initial encounter: Secondary | ICD-10-CM | POA: Diagnosis present

## 2018-08-13 DIAGNOSIS — M549 Dorsalgia, unspecified: Secondary | ICD-10-CM

## 2018-08-13 HISTORY — DX: Calculus of kidney: N20.0

## 2018-08-13 LAB — CBC WITH DIFFERENTIAL/PLATELET
Abs Immature Granulocytes: 0.02 10*3/uL (ref 0.00–0.07)
BASOS ABS: 0 10*3/uL (ref 0.0–0.1)
BASOS PCT: 0 %
EOS PCT: 2 %
Eosinophils Absolute: 0.1 10*3/uL (ref 0.0–0.5)
HCT: 41.2 % (ref 39.0–52.0)
HEMOGLOBIN: 14.2 g/dL (ref 13.0–17.0)
Immature Granulocytes: 0 %
LYMPHS PCT: 27 %
Lymphs Abs: 2 10*3/uL (ref 0.7–4.0)
MCH: 30.9 pg (ref 26.0–34.0)
MCHC: 34.5 g/dL (ref 30.0–36.0)
MCV: 89.6 fL (ref 80.0–100.0)
MONO ABS: 0.6 10*3/uL (ref 0.1–1.0)
Monocytes Relative: 8 %
NEUTROS ABS: 4.6 10*3/uL (ref 1.7–7.7)
NRBC: 0 % (ref 0.0–0.2)
Neutrophils Relative %: 63 %
PLATELETS: 215 10*3/uL (ref 150–400)
RBC: 4.6 MIL/uL (ref 4.22–5.81)
RDW: 11.5 % (ref 11.5–15.5)
WBC: 7.3 10*3/uL (ref 4.0–10.5)

## 2018-08-13 LAB — COMPREHENSIVE METABOLIC PANEL
ALT: 32 U/L (ref 0–44)
AST: 22 U/L (ref 15–41)
Albumin: 4.6 g/dL (ref 3.5–5.0)
Alkaline Phosphatase: 46 U/L (ref 38–126)
Anion gap: 10 (ref 5–15)
BILIRUBIN TOTAL: 0.6 mg/dL (ref 0.3–1.2)
BUN: 19 mg/dL (ref 6–20)
CHLORIDE: 103 mmol/L (ref 98–111)
CO2: 24 mmol/L (ref 22–32)
Calcium: 9.1 mg/dL (ref 8.9–10.3)
Creatinine, Ser: 1.04 mg/dL (ref 0.61–1.24)
Glucose, Bld: 101 mg/dL — ABNORMAL HIGH (ref 70–99)
POTASSIUM: 3.8 mmol/L (ref 3.5–5.1)
Sodium: 137 mmol/L (ref 135–145)
TOTAL PROTEIN: 7.9 g/dL (ref 6.5–8.1)

## 2018-08-13 MED ORDER — IOPAMIDOL (ISOVUE-300) INJECTION 61%
100.0000 mL | Freq: Once | INTRAVENOUS | Status: AC | PRN
  Administered 2018-08-13: 100 mL via INTRAVENOUS
  Filled 2018-08-13: qty 100

## 2018-08-13 MED ORDER — SODIUM CHLORIDE 0.9 % IV BOLUS
1000.0000 mL | Freq: Once | INTRAVENOUS | Status: AC
  Administered 2018-08-13: 1000 mL via INTRAVENOUS
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
## 2018-08-14 ENCOUNTER — Other Ambulatory Visit: Payer: Self-pay

## 2018-08-14 MED ORDER — MELOXICAM 15 MG PO TABS: 15.0000 mg | ORAL_TABLET | Freq: Every day | ORAL | 2 refills | Status: DC

## 2018-08-14 MED ORDER — BACLOFEN 10 MG PO TABS: 10.0000 mg | ORAL_TABLET | Freq: Three times a day (TID) | ORAL | 1 refills | Status: DC

## 2018-08-14 MED ORDER — ORPHENADRINE CITRATE 30 MG/ML IJ SOLN
60.0000 mg | Freq: Two times a day (BID) | INTRAMUSCULAR | Status: DC
  Administered 2018-08-14: 60 mg via INTRAVENOUS

## 2018-08-14 MED ORDER — ORPHENADRINE CITRATE 30 MG/ML IJ SOLN
INTRAMUSCULAR | Status: AC
  Administered 2018-08-14: 60 mg via INTRAVENOUS
  Filled 2018-08-14: qty 2

## 2018-08-14 NOTE — ED Notes (Signed)
Holding med, pt is hoping to return to work tonight.

## 2018-08-14 NOTE — Discharge Instructions (Addendum)
Follow-up with a physician of Worker's Comp. choice if you are not better in 3 to 5 days.  Return to the emergency department if you are worsening.  He may take the meloxicam and work.  However if you are taking the baclofen and you should not drive a vehicle.  Apply ice to all areas that hurt.

## 2018-09-15 ENCOUNTER — Other Ambulatory Visit: Admission: AD | Admit: 2018-09-15 | Payer: Worker's Compensation | Admitting: Family Medicine

## 2018-11-17 ENCOUNTER — Telehealth: Payer: Self-pay | Admitting: Family Medicine

## 2018-11-17 NOTE — Telephone Encounter (Signed)
Pt needing to know his blood type for work.  Please give pt a call back.  Thanks, [employer]

## 2018-11-17 NOTE — Telephone Encounter (Signed)
Spoke with patient and he states that he will give the office a call back to see exactly what his job needs.

## 2018-12-27 ENCOUNTER — Encounter: Payer: Self-pay | Admitting: Physician Assistant

## 2018-12-27 ENCOUNTER — Ambulatory Visit: Payer: 59 | Admitting: Physician Assistant

## 2018-12-27 ENCOUNTER — Other Ambulatory Visit: Payer: Self-pay

## 2018-12-27 VITALS — BP 129/82 | HR 112 | Temp 99.1°F | Resp 16 | Wt 241.8 lb

## 2018-12-27 DIAGNOSIS — J020 Streptococcal pharyngitis: Secondary | ICD-10-CM | POA: Diagnosis not present

## 2018-12-27 DIAGNOSIS — J029 Acute pharyngitis, unspecified: Secondary | ICD-10-CM

## 2018-12-27 LAB — POCT RAPID STREP A (OFFICE): Rapid Strep A Screen: POSITIVE — AB

## 2018-12-27 MED ORDER — AMOXICILLIN 875 MG PO TABS: 875.0000 mg | ORAL_TABLET | Freq: Two times a day (BID) | ORAL | 0 refills | Status: AC

## 2018-12-27 NOTE — Patient Instructions (Signed)

## 2018-12-27 NOTE — Progress Notes (Signed)
Patient: Troy Sawyer Male    DOB: 1986-03-27   33 y.o.   MRN: [id number] Visit Date: 12/27/2018  Today's Provider: Trey Sailors, PA-C   Chief Complaint  Patient presents with  . Sore Throat   Subjective:     Sore Throat   This is a new problem. The current episode started today. Neither side of throat is experiencing more pain than the other. The maximum temperature recorded prior to his arrival was 100.4 - 100.9 F. The fever has been present for less than 1 day. Associated symptoms include congestion, coughing, ear pain (right ear tender to touch), headaches, a hoarse voice, neck pain and trouble swallowing. Pertinent negatives include no abdominal pain, diarrhea, drooling, ear discharge, plugged ear sensation, shortness of breath, stridor, swollen glands or vomiting. He has had no exposure to strep or mono. He has tried NSAIDs for the symptoms. The treatment provided no relief.    No Known Allergies   Current Outpatient Medications:  .  ranitidine (ZANTAC) 75 MG tablet, Take 75 mg by mouth as needed for heartburn. , Disp: , Rfl:  .  amoxicillin (AMOXIL) 875 MG tablet, Take 1 tablet (875 mg total) by mouth 2 (two) times daily for 10 days., Disp: 20 tablet, Rfl: 0  Review of Systems  Constitutional: Positive for fever.  HENT: Positive for congestion, ear pain (right ear tender to touch), hoarse voice and trouble swallowing. Negative for drooling and ear discharge.   Respiratory: Positive for cough. Negative for shortness of breath and stridor.   Gastrointestinal: Negative.  Negative for abdominal pain, diarrhea and vomiting.  Musculoskeletal: Positive for neck pain.  Neurological: Positive for headaches.    Social History   Tobacco Use  . Smoking status: Former Games developer  . Smokeless tobacco: Former Neurosurgeon  . Tobacco comment: quit in 2004  Substance Use Topics  . Alcohol use: Yes    Alcohol/week: 0.0 standard drinks    Comment: OCCASIONALLY      Objective:   BP  129/82   Pulse (!) 112   Temp 99.1 F (37.3 C) (Oral)   Resp 16   Wt 241 lb 12.8 oz (109.7 kg)   SpO2 97%   BMI 31.90 kg/m  Vitals:   12/27/18 1152  BP: 129/82  Pulse: (!) 112  Resp: 16  Temp: 99.1 F (37.3 C)  TempSrc: Oral  SpO2: 97%  Weight: 241 lb 12.8 oz (109.7 kg)    Physical Exam Constitutional:      Appearance: He is well-developed.  HENT:     Right Ear: Tympanic membrane normal.     Left Ear: Tympanic membrane normal.     Mouth/Throat:     Mouth: Mucous membranes are moist. No oral lesions.     Pharynx: Uvula midline. Pharyngeal swelling and posterior oropharyngeal erythema present. No oropharyngeal exudate or uvula swelling.     Tonsils: No tonsillar exudate or tonsillar abscesses. Swelling: 1+ on the right. 1+ on the left.  Eyes:     Conjunctiva/sclera: Conjunctivae normal.  Neck:     Musculoskeletal: Neck supple.  Cardiovascular:     Rate and Rhythm: Normal rate and regular rhythm.  Pulmonary:     Effort: Pulmonary effort is normal.     Breath sounds: Normal breath sounds.  Lymphadenopathy:     Cervical: Cervical adenopathy present.  Skin:    General: Skin is warm and dry.  Neurological:     Mental Status: He is alert.  Psychiatric:  Mood and Affect: Mood normal.        Behavior: Behavior normal.         Assessment & Plan    1. Strep throat   2. Acute pharyngitis, unspecified etiology  - POCT rapid strep A - amoxicillin (AMOXIL) 875 MG tablet; Take 1 tablet (875 mg total) by mouth 2 (two) times daily for 10 days.  Dispense: 20 tablet; Refill: 0     Patient seen and examined by Osvaldo Angst, PA-C, note scribed by Sheliah Hatch, NCMA Trey Sailors, PA-C  [facility]
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
## 2019-08-10 NOTE — Progress Notes (Signed)
Patient: Troy Sawyer, Male    DOB: 1985/12/20, 33 y.o.   MRN: [id number] Visit Date: 08/11/2019  Today's Provider: Vernie Murders, PA   Chief Complaint  Patient presents with  . Annual Exam   Subjective:  Troy Sawyer is a 33 y.o. male who presents today for health maintenance and complete physical. He feels well. He reports exercising yes. He reports he is sleeping well.  Past Medical History:  Diagnosis Date  . GERD (gastroesophageal reflux disease)   . Kidney stone    Family History  Problem Relation Age of Onset  . Healthy Mother   . Diabetes Father   . Healthy Sister   . Healthy Brother   . Lung cancer Paternal Grandfather   . Liver cancer Paternal Grandfather   . Healthy Sister   . Healthy Brother   . Healthy Brother   . Prostate cancer Neg Hx   . Bladder Cancer Neg Hx   . Kidney cancer Neg Hx     Immunization History  Administered Date(s) Administered  . Influenza,inj,Quad PF,6+ Mos 09/06/2017  . Tdap 09/06/2017   Review of Systems  All other systems reviewed and are negative.  Social History   Socioeconomic History  . Marital status: Married    Spouse name: Not on file  . Number of children: Not on file  . Years of education: Not on file  . Highest education level: Not on file  Occupational History  . Not on file  Social Needs  . Financial resource strain: Not on file  . Food insecurity    Worry: Not on file    Inability: Not on file  . Transportation needs    Medical: Not on file    Non-medical: Not on file  Tobacco Use  . Smoking status: Former Research scientist (life sciences)  . Smokeless tobacco: Former Systems developer  . Tobacco comment: quit in 2004  Substance and Sexual Activity  . Alcohol use: Yes    Alcohol/week: 0.0 standard drinks    Comment: OCCASIONALLY  . Drug use: No  . Sexual activity: Not on file  Lifestyle  . Physical activity    Days per week: Not on file    Minutes per session: Not on file  . Stress: Not on file  Relationships  . Social Product manager on phone: Not on file    Gets together: Not on file    Attends religious service: Not on file    Active member of club or organization: Not on file    Attends meetings of clubs or organizations: Not on file    Relationship status: Not on file  . Intimate partner violence    Fear of current or ex partner: Not on file    Emotionally abused: Not on file    Physically abused: Not on file    Forced sexual activity: Not on file  Other Topics Concern  . Not on file  Social History Narrative  . Not on file   Patient Active Problem List   Diagnosis Date Noted  . Decreased libido 05/20/2015  . Acid reflux 05/20/2015   Past Surgical History:  Procedure Laterality Date  . CYSTOSCOPY/URETEROSCOPY/HOLMIUM LASER/STENT PLACEMENT Left 04/13/2016   Procedure: CYSTOSCOPY/URETEROSCOPY/HOLMIUM LASER/STENT PLACEMENT;  Surgeon: Ardis Hughs, MD;  Location: [facility] ORS;  Service: Urology;  Laterality: Left;  . NO PAST SURGERIES     His family history includes Diabetes in his father; Healthy in his brother, brother, brother, mother, sister, and sister; Liver cancer in his  paternal grandfather; Lung cancer in his paternal grandfather.     Outpatient Encounter Medications as of 08/11/2019  Medication Sig  . Calcium Carbonate Antacid (ALKA-SELTZER ANTACID PO) Take by mouth. Chewable Tablet  . ranitidine (ZANTAC) 75 MG tablet Take 75 mg by mouth as needed for heartburn.    No facility-administered encounter medications on file as of 08/11/2019.    Patient Care Team: Shakeisha Horine, Jodell Cipro, PA as PCP - General (Physician Assistant)     Objective:   Vitals:  Vitals:   08/11/19 0900  BP: 120/81  Pulse: 74  Resp: 16  Temp: (!) 97.1 F (36.2 C)  TempSrc: Other (Comment)  SpO2: 97%  Weight: 241 lb (109.3 kg)  Height: 6' (1.829 m)  Body mass index is 32.69 kg/m. Wt Readings from Last 3 Encounters:  08/11/19 241 lb (109.3 kg)  12/27/18 241 lb 12.8 oz (109.7 kg)  08/13/18 240 lb (108.9 kg)    Physical Exam Constitutional:      Appearance: He is well-developed.  HENT:     Head: Normocephalic and atraumatic.     Right Ear: External ear normal.     Left Ear: External ear normal.     Nose: Nose normal.  Eyes:     General:        Right eye: No discharge.     Conjunctiva/sclera: Conjunctivae normal.     Pupils: Pupils are equal, round, and reactive to light.  Neck:     Musculoskeletal: Normal range of motion and neck supple.     Thyroid: No thyromegaly.     Trachea: No tracheal deviation.  Cardiovascular:     Rate and Rhythm: Normal rate and regular rhythm.     Heart sounds: Normal heart sounds. No murmur.  Pulmonary:     Effort: Pulmonary effort is normal. No respiratory distress.     Breath sounds: Normal breath sounds. No wheezing or rales.  Chest:     Chest wall: No tenderness.  Abdominal:     General: There is no distension.     Palpations: Abdomen is soft. There is no mass.     Tenderness: There is no abdominal tenderness. There is no guarding or rebound.  Genitourinary:    Penis: Normal.      Scrotum/Testes: Normal.     Prostate: Normal.     Rectum: Normal. Guaiac result negative.  Musculoskeletal: Normal range of motion.        General: No tenderness.  Lymphadenopathy:     Cervical: No cervical adenopathy.  Skin:    General: Skin is warm and dry.     Findings: No erythema or rash.  Neurological:     Mental Status: He is alert and oriented to person, place, and time.     Cranial Nerves: No cranial nerve deficit.     Motor: No abnormal muscle tone.     Coordination: Coordination normal.     Deep Tendon Reflexes: Reflexes are normal and symmetric. Reflexes normal.  Psychiatric:        Behavior: Behavior normal.        Thought Content: Thought content normal.        Judgment: Judgment normal.     Depression Screen PHQ 2/9 Scores 08/11/2019  PHQ - 2 Score 0  PHQ- 9 Score 0    Assessment & Plan:     Routine Health Maintenance and Physical  Exam  Exercise Activities and Dietary recommendations Goals   Encouraged to maintain 30-40 minute workouts 3-4 days a  week and work on a little weight loss.    Immunization History  Administered Date(s) Administered  . Influenza,inj,Quad PF,6+ Mos 09/06/2017  . Tdap 09/06/2017   Health Maintenance  Topic Date Due  . HIV Screening  03/05/1999  . INFLUENZA VACCINE  05/19/2020 (Originally 05/20/2019)  . TETANUS/TDAP  09/07/2027    Discussed health benefits of physical activity, and encouraged him to engage in regular exercise appropriate for his age and condition.   1. Annual physical exam Good general health. 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 Given anticipatory counseling and check routine labs. - CBC with Differential/Platelet - Comprehensive metabolic panel - Lipid panel - TSH  2. Gastroesophageal reflux disease, unspecified whether esophagitis present Rare dyspepsia with certain acidic foods. Occasionally will use an antacid. No hematemesis, melena or hematochezia. No abdominal pains.  - CBC with Differential/Platelet

## 2019-08-11 ENCOUNTER — Ambulatory Visit (INDEPENDENT_AMBULATORY_CARE_PROVIDER_SITE_OTHER): Payer: 59 | Admitting: Family Medicine

## 2019-08-11 ENCOUNTER — Other Ambulatory Visit: Payer: Self-pay

## 2019-08-11 ENCOUNTER — Encounter: Payer: Self-pay | Admitting: Family Medicine

## 2019-08-11 VITALS — BP 120/81 | HR 74 | Temp 97.1°F | Resp 16 | Ht 72.0 in | Wt 241.0 lb

## 2019-08-11 DIAGNOSIS — K219 Gastro-esophageal reflux disease without esophagitis: Secondary | ICD-10-CM

## 2019-08-11 DIAGNOSIS — Z0000 Encounter for general adult medical examination without abnormal findings: Secondary | ICD-10-CM

## 2019-08-12 LAB — CBC WITH DIFFERENTIAL/PLATELET
Basophils Absolute: 0 10*3/uL (ref 0.0–0.2)
Basos: 1 %
EOS (ABSOLUTE): 0.2 10*3/uL (ref 0.0–0.4)
Eos: 4 %
Hematocrit: 42 % (ref 37.5–51.0)
Hemoglobin: 14.3 g/dL (ref 13.0–17.7)
Immature Grans (Abs): 0 10*3/uL (ref 0.0–0.1)
Immature Granulocytes: 0 %
Lymphocytes Absolute: 1.4 10*3/uL (ref 0.7–3.1)
Lymphs: 31 %
MCH: 30.8 pg (ref 26.6–33.0)
MCHC: 34 g/dL (ref 31.5–35.7)
MCV: 90 fL (ref 79–97)
Monocytes Absolute: 0.5 10*3/uL (ref 0.1–0.9)
Monocytes: 10 %
Neutrophils Absolute: 2.4 10*3/uL (ref 1.4–7.0)
Neutrophils: 54 %
Platelets: 189 10*3/uL (ref 150–450)
RBC: 4.65 x10E6/uL (ref 4.14–5.80)
RDW: 12.1 % (ref 11.6–15.4)
WBC: 4.5 10*3/uL (ref 3.4–10.8)

## 2019-08-12 LAB — COMPREHENSIVE METABOLIC PANEL
ALT: 44 IU/L (ref 0–44)
AST: 43 IU/L — ABNORMAL HIGH (ref 0–40)
Albumin/Globulin Ratio: 2 (ref 1.2–2.2)
Albumin: 4.7 g/dL (ref 4.0–5.0)
Alkaline Phosphatase: 55 IU/L (ref 39–117)
BUN/Creatinine Ratio: 13 (ref 9–20)
BUN: 14 mg/dL (ref 6–20)
Bilirubin Total: 0.8 mg/dL (ref 0.0–1.2)
CO2: 23 mmol/L (ref 20–29)
Calcium: 9.5 mg/dL (ref 8.7–10.2)
Chloride: 104 mmol/L (ref 96–106)
Creatinine, Ser: 1.09 mg/dL (ref 0.76–1.27)
GFR calc Af Amer: 101 mL/min/{1.73_m2} (ref >59)
GFR calc non Af Amer: 87 mL/min/{1.73_m2} (ref >59)
Globulin, Total: 2.4 g/dL (ref 1.5–4.5)
Glucose: 92 mg/dL (ref 65–99)
Potassium: 4.1 mmol/L (ref 3.5–5.2)
Sodium: 141 mmol/L (ref 134–144)
Total Protein: 7.1 g/dL (ref 6.0–8.5)

## 2019-08-12 LAB — LIPID PANEL
Chol/HDL Ratio: 3.2 ratio (ref 0.0–5.0)
Cholesterol, Total: 179 mg/dL (ref 100–199)
HDL: 56 mg/dL (ref >39)
LDL Chol Calc (NIH): 108 mg/dL — ABNORMAL HIGH (ref 0–99)
Triglycerides: 82 mg/dL (ref 0–149)
VLDL Cholesterol Cal: 15 mg/dL (ref 5–40)

## 2019-08-12 LAB — TSH: TSH: 0.759 u[IU]/mL (ref 0.450–4.500)

## 2020-10-01 ENCOUNTER — Ambulatory Visit: Payer: 59 | Admitting: Family Medicine

## 2020-10-01 NOTE — Progress Notes (Deleted)
Acute Office Visit  Subjective:    Patient ID: Troy Sawyer, male    DOB: 1985/12/19, 34 y.o.   MRN: [id number]  No chief complaint on file.   HPI Patient is in today for low back pain.  Past Medical History:  Diagnosis Date  . GERD (gastroesophageal reflux disease)   . Kidney stone     Past Surgical History:  Procedure Laterality Date  . CYSTOSCOPY/URETEROSCOPY/HOLMIUM LASER/STENT PLACEMENT Left 04/13/2016   Procedure: CYSTOSCOPY/URETEROSCOPY/HOLMIUM LASER/STENT PLACEMENT;  Surgeon: Crist Fat, MD;  Location: [facility];  Service: Urology;  Laterality: Left;  . NO PAST SURGERIES      Family History  Problem Relation Age of Onset  . Healthy Mother   . Diabetes Father   . Healthy Sister   . Healthy Brother   . Lung cancer Paternal Grandfather   . Liver cancer Paternal Grandfather   . Healthy Sister   . Healthy Brother   . Healthy Brother   . Prostate cancer Neg Hx   . Bladder Cancer Neg Hx   . Kidney cancer Neg Hx     Social History   Socioeconomic History  . Marital status: Married    Spouse name: Not on file  . Number of children: Not on file  . Years of education: Not on file  . Highest education level: Not on file  Occupational History  . Not on file  Tobacco Use  . Smoking status: Former Games developer  . Smokeless tobacco: Former Neurosurgeon  . Tobacco comment: quit in 2004  Substance and Sexual Activity  . Alcohol use: Yes    Alcohol/week: 0.0 standard drinks    Comment: OCCASIONALLY  . Drug use: No  . Sexual activity: Not on file  Other Topics Concern  . Not on file  Social History Narrative  . Not on file   Social Determinants of Health   Financial Resource Strain: Not on file  Food Insecurity: Not on file  Transportation Needs: Not on file  Physical Activity: Not on file  Stress: Not on file  Social Connections: Not on file  Intimate Partner Violence: Not on file    Outpatient Medications Prior to Visit  Medication Sig Dispense Refill  .  Calcium Carbonate Antacid (ALKA-SELTZER ANTACID PO) Take by mouth. Chewable Tablet    . ranitidine (ZANTAC) 75 MG tablet Take 75 mg by mouth as needed for heartburn.      No facility-administered medications prior to visit.    No Known Allergies  Review of Systems     Objective:    Physical Exam  There were no vitals taken for this visit. Wt Readings from Last 3 Encounters:  08/11/19 241 lb (109.3 kg)  12/27/18 241 lb 12.8 oz (109.7 kg)  08/13/18 240 lb (108.9 kg)    Health Maintenance Due  Topic Date Due  . Hepatitis C Screening  Never done  . COVID-19 Vaccine (1) Never done  . HIV Screening  Never done  . INFLUENZA VACCINE  05/19/2020    There are no preventive care reminders to display for this patient.   Lab Results  Component Value Date   TSH 0.759 08/11/2019   Lab Results  Component Value Date   WBC 4.5 08/11/2019   HGB 14.3 08/11/2019   HCT 42.0 08/11/2019   MCV 90 08/11/2019   PLT 189 08/11/2019   Lab Results  Component Value Date   NA 141 08/11/2019   K 4.1 08/11/2019   CO2 23 08/11/2019  GLUCOSE 92 08/11/2019   BUN 14 08/11/2019   CREATININE 1.09 08/11/2019   BILITOT 0.8 08/11/2019   ALKPHOS 55 08/11/2019   AST 43 (H) 08/11/2019   ALT 44 08/11/2019   PROT 7.1 08/11/2019   ALBUMIN 4.7 08/11/2019   CALCIUM 9.5 08/11/2019   ANIONGAP 10 08/13/2018   Lab Results  Component Value Date   CHOL 179 08/11/2019   Lab Results  Component Value Date   HDL 56 08/11/2019   Lab Results  Component Value Date   LDLCALC 108 (H) 08/11/2019   Lab Results  Component Value Date   TRIG 82 08/11/2019   Lab Results  Component Value Date   CHOLHDL 3.2 08/11/2019   No results found for: HGBA1C     Assessment & Plan:   Problem List Items Addressed This Visit   None      No orders of the defined types were placed in this encounter.    Adline Peals, CMA

## 2020-10-03 NOTE — Progress Notes (Signed)
Acute Office Visit  Subjective:    Patient ID: Troy Sawyer, male    DOB: 1986/10/07, 34 y.o.   MRN: [id number]  No chief complaint on file.   HPI Patient is in today for low back pain.  He reports that it has been going on for 5 days.  He does admit that it feels a little better today.  He states on Sunday he was throwing something in his truck and when he twisted to release it he felt his back hurt.  Certain movements (mainly twisting) will reproduce th pain to a greater degree.   On the first day the patient used Ibuprofen and then since that time he has been using ice and heat.  With no movements he has a dull pain.  He denies radiation to any area. 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  Worst pain 7-8/10, in general when having just the dull pain he rates it as a 3/10.  Past Medical History:  Diagnosis Date   GERD (gastroesophageal reflux disease)    Kidney stone     Past Surgical History:  Procedure Laterality Date   CYSTOSCOPY/URETEROSCOPY/HOLMIUM LASER/STENT PLACEMENT Left 04/13/2016   Procedure: CYSTOSCOPY/URETEROSCOPY/HOLMIUM LASER/STENT PLACEMENT;  Surgeon: Crist Fat, MD;  Location: [facility];  Service: Urology;  Laterality: Left;   NO PAST SURGERIES      Family History  Problem Relation Age of Onset   Healthy Mother    Diabetes Father    Healthy Sister    Healthy Brother    Lung cancer Paternal Grandfather    Liver cancer Paternal Grandfather    Healthy Sister    Healthy Brother    Healthy Brother    Prostate cancer Neg Hx    Bladder Cancer Neg Hx    Kidney cancer Neg Hx     Social History   Socioeconomic History   Marital status: Married    Spouse name: Not on file   Number of children: Not on file   Years of education: Not on file   Highest education level: Not on file  Occupational History   Not on file  Tobacco Use   Smoking status: Former  Smoker   Smokeless tobacco: Former Neurosurgeon   Tobacco comment: quit in 2004  Substance and Sexual Activity   Alcohol use: Yes    Alcohol/week: 0.0 standard drinks    Comment: OCCASIONALLY   Drug use: No   Sexual activity: Not on file  Other Topics Concern   Not on file  Social History Narrative   Not on file   Social Determinants of Health   Financial Resource Strain: Not on file  Food Insecurity: Not on file  Transportation Needs: Not on file  Physical Activity: Not on file  Stress: Not on file  Social Connections: Not on file  Intimate Partner Violence: Not on file    Outpatient Medications Prior to Visit  Medication Sig Dispense Refill   Calcium Carbonate Antacid (ALKA-SELTZER ANTACID PO) Take by mouth. Chewable Tablet     ranitidine (ZANTAC) 75 MG tablet Take 75 mg by mouth as needed for heartburn.  (Patient not taking: Reported on 10/04/2020)     No facility-administered medications prior to visit.    No Known Allergies  Review of Systems  Genitourinary: Negative for difficulty urinating and dysuria.  Musculoskeletal: Positive for back pain. Negative for gait problem.  Objective:    Physical Exam Constitutional:      General: He is not in acute distress.    Appearance: He is well-developed and well-nourished.  HENT:     Head: Normocephalic and atraumatic.     Right Ear: Hearing normal.     Left Ear: Hearing normal.     Nose: Nose normal.  Eyes:     General: Lids are normal. No scleral icterus.       Right eye: No discharge.        Left eye: No discharge.     Conjunctiva/sclera: Conjunctivae normal.  Cardiovascular:     Rate and Rhythm: Regular rhythm.     Heart sounds: Normal heart sounds.  Pulmonary:     Effort: Pulmonary effort is normal. No respiratory distress.     Breath sounds: Normal breath sounds.  Abdominal:     General: Bowel sounds are normal.     Palpations: Abdomen is soft.     Tenderness: There is no abdominal tenderness.   Musculoskeletal:        General: Tenderness present.     Cervical back: Neck supple.     Comments: Some tenderness in the left mid lumbar musculature region. Fair ROM. Some sharp pains with certain twisting movements. DTR's symmetric without numbness. SLR's 90 degrees without pain.  Skin:    General: Skin is intact.     Findings: No lesion or rash.  Neurological:     Mental Status: He is alert and oriented to person, place, and time.  Psychiatric:        Mood and Affect: Mood and affect normal.        Speech: Speech normal.        Behavior: Behavior normal.        Thought Content: Thought content normal.     BP 121/81 (BP Location: Right Arm, Patient Position: Sitting, Cuff Size: Normal)    Pulse 78    Temp 98.2 F (36.8 C) (Oral)    Wt 241 lb (109.3 kg)    SpO2 100%    BMI 32.69 kg/m  Wt Readings from Last 3 Encounters:  10/04/20 241 lb (109.3 kg)  08/11/19 241 lb (109.3 kg)  12/27/18 241 lb 12.8 oz (109.7 kg)    Health Maintenance Due  Topic Date Due   Hepatitis C Screening  Never done   COVID-19 Vaccine (1) Never done   HIV Screening  Never done   INFLUENZA VACCINE  05/19/2020    There are no preventive care reminders to display for this patient.   Lab Results  Component Value Date   TSH 0.759 08/11/2019   Lab Results  Component Value Date   WBC 4.5 08/11/2019   HGB 14.3 08/11/2019   HCT 42.0 08/11/2019   MCV 90 08/11/2019   PLT 189 08/11/2019   Lab Results  Component Value Date   NA 141 08/11/2019   K 4.1 08/11/2019   CO2 23 08/11/2019   GLUCOSE 92 08/11/2019   BUN 14 08/11/2019   CREATININE 1.09 08/11/2019   BILITOT 0.8 08/11/2019   ALKPHOS 55 08/11/2019   AST 43 (H) 08/11/2019   ALT 44 08/11/2019   PROT 7.1 08/11/2019   ALBUMIN 4.7 08/11/2019   CALCIUM 9.5 08/11/2019   ANIONGAP 10 08/13/2018   Lab Results  Component Value Date   CHOL 179 08/11/2019   Lab Results  Component Value Date   HDL 56 08/11/2019   Lab Results  Component  Value Date  LDLCALC 108 (H) 08/11/2019   Lab Results  Component Value Date   TRIG 82 08/11/2019   Lab Results  Component Value Date   CHOLHDL 3.2 08/11/2019   No results found for: HGBA1C     Assessment & Plan:   1. Acute left-sided low back pain without sciatica Developed LBP 5 days ago after twisting to throw a back of charcoal into his truck. Described it as a sharp "catch". Remembers a heavy lifting workout a couple day before this injury. Ibuprofen, ice and heat treatment has helped some. No dysuria, hematuria or flank pain similar to his past kidney stones. Recommend Icy-Hot Lidocaine Patch, Ibuprofen 200 mg 3 tablets qid and given muscle relaxant. Limit lifting and exercise below maximum effort. Recheck if no better in 5 days. - methocarbamol (ROBAXIN) 750 MG tablet; Take 1 tablet (750 mg total) by mouth every 6 (six) hours as needed for muscle spasms.  Dispense: 40 tablet; Refill: 0    No orders of the defined types were placed in this encounter.    Adline Peals, CMA

## 2020-10-04 ENCOUNTER — Other Ambulatory Visit: Payer: Self-pay

## 2020-10-04 ENCOUNTER — Ambulatory Visit: Payer: 59 | Admitting: Family Medicine

## 2020-10-04 ENCOUNTER — Encounter: Payer: Self-pay | Admitting: Family Medicine

## 2020-10-04 VITALS — BP 121/81 | HR 78 | Temp 98.2°F | Wt 241.0 lb

## 2020-10-04 DIAGNOSIS — M545 Low back pain, unspecified: Secondary | ICD-10-CM | POA: Diagnosis not present

## 2020-10-04 MED ORDER — METHOCARBAMOL 750 MG PO TABS: 750.0000 mg | ORAL_TABLET | Freq: Four times a day (QID) | ORAL | 0 refills | Status: DC | PRN

## 2020-10-04 NOTE — Patient Instructions (Signed)
Acute Back Pain, Adult Acute back pain is sudden and usually short-lived. It is often caused by an injury to the muscles and tissues in the back. The injury may result from:  A muscle or ligament getting overstretched or torn (strained). Ligaments are tissues that connect bones to each other. Lifting something improperly can cause a back strain.  Wear and tear (degeneration) of the spinal disks. Spinal disks are circular tissue that provides cushioning between the bones of the spine (vertebrae).  Twisting motions, such as while playing sports or doing yard work.  A hit to the back.  Arthritis. You may have a physical exam, lab tests, and imaging tests to find the cause of your pain. Acute back pain usually goes away with rest and home care. Follow these instructions at home: Managing pain, stiffness, and swelling  Take over-the-counter and prescription medicines only as told by your health care provider.  Your health care provider may recommend applying ice during the first 24-48 hours after your pain starts. To do this: ? Put ice in a plastic bag. ? Place a towel between your skin and the bag. ? Leave the ice on for 20 minutes, 2-3 times a day.  If directed, apply heat to the affected area as often as told by your health care provider. Use the heat source that your health care provider recommends, such as a moist heat pack or a heating pad. ? Place a towel between your skin and the heat source. ? Leave the heat on for 20-30 minutes. ? Remove the heat if your skin turns bright red. This is especially important if you are unable to feel pain, heat, or cold. You have a greater risk of getting burned. Activity   Do not stay in bed. Staying in bed for more than 1-2 days can delay your recovery.  Sit up and stand up straight. Avoid leaning forward when you sit, or hunching over when you stand. ? If you work at a desk, sit close to it so you do not need to lean over. Keep your chin tucked  in. Keep your neck drawn back, and keep your elbows bent at a right angle. Your arms should look like the letter "L." ? Sit high and close to the steering wheel when you drive. Add lower back (lumbar) support to your car seat, if needed.  Take short walks on even surfaces as soon as you are able. Try to increase the length of time you walk each day.  Do not sit, drive, or stand in one place for more than 30 minutes at a time. Sitting or standing for long periods of time can put stress on your back.  Do not drive or use heavy machinery while taking prescription pain medicine.  Use proper lifting techniques. When you bend and lift, use positions that put less stress on your back: ? Bend your knees. ? Keep the load close to your body. ? Avoid twisting.  Exercise regularly as told by your health care provider. Exercising helps your back heal faster and helps prevent back injuries by keeping muscles strong and flexible.  Work with a physical therapist to make a safe exercise program, as recommended by your health care provider. Do any exercises as told by your physical therapist. Lifestyle  Maintain a healthy weight. Extra weight puts stress on your back and makes it difficult to have good posture.  Avoid activities or situations that make you feel anxious or stressed. Stress and anxiety increase muscle   tension and can make back pain worse. Learn ways to manage anxiety and stress, such as through exercise. General instructions  Sleep on a firm mattress in a comfortable position. Try lying on your side with your knees slightly bent. If you lie on your back, put a pillow under your knees.  Follow your treatment plan as told by your health care provider. This may include: ? Cognitive or behavioral therapy. ? Acupuncture or massage therapy. ? Meditation or yoga. Contact a health care provider if:  You have pain that is not relieved with rest or medicine.  You have increasing pain going down  into your legs or buttocks.  Your pain does not improve after 2 weeks.  You have pain at night.  You lose weight without trying.  You have a fever or chills. Get help right away if:  You develop new bowel or bladder control problems.  You have unusual weakness or numbness in your arms or legs.  You develop nausea or vomiting.  You develop abdominal pain.  You feel faint. Summary  Acute back pain is sudden and usually short-lived.  Use proper lifting techniques. When you bend and lift, use positions that put less stress on your back.  Take over-the-counter and prescription medicines and apply heat or ice as directed by your health care provider. This information is not intended to replace advice given to you by your health care provider. Make sure you discuss any questions you have with your health care provider. Document Revised: 01/24/2019 Document Reviewed: 05/19/2017 Elsevier Patient Education  2020 Elsevier Inc.  

## 2020-10-29 ENCOUNTER — Other Ambulatory Visit: Payer: Self-pay

## 2020-10-29 ENCOUNTER — Ambulatory Visit: Payer: 59

## 2020-10-29 DIAGNOSIS — Z1152 Encounter for screening for COVID-19: Secondary | ICD-10-CM

## 2020-10-29 LAB — POC SOFIA 2 FLU + SARS ANTIGEN FIA
Influenza A, POC: NEGATIVE
Influenza B, POC: NEGATIVE
SARS Coronavirus 2 Ag: NEGATIVE

## 2021-02-14 ENCOUNTER — Encounter: Payer: Self-pay | Admitting: Family Medicine

## 2021-02-14 ENCOUNTER — Telehealth: Payer: Self-pay

## 2021-02-14 ENCOUNTER — Telehealth (INDEPENDENT_AMBULATORY_CARE_PROVIDER_SITE_OTHER): Payer: 59 | Admitting: Family Medicine

## 2021-02-14 DIAGNOSIS — J040 Acute laryngitis: Secondary | ICD-10-CM | POA: Diagnosis not present

## 2021-02-14 MED ORDER — AMOXICILLIN 875 MG PO TABS: 875.0000 mg | ORAL_TABLET | Freq: Two times a day (BID) | ORAL | 0 refills | Status: AC

## 2021-02-14 MED ORDER — PREDNISONE 5 MG PO TABS: ORAL_TABLET | ORAL | 0 refills | Status: DC

## 2021-02-14 NOTE — Progress Notes (Signed)
Telephone Visit    Virtual Visit via Video Note   This visit type was conducted due to national recommendations for restrictions regarding the COVID-19 Pandemic (e.g. social distancing) in an effort to limit this patient's exposure and mitigate transmission in our community. This patient is at least at moderate risk for complications without adequate follow up. This format is felt to be most appropriate for this patient at this time. Physical exam was limited by quality of the audio technology used for the visit.   Patient location: Work Provider location: Office  I discussed the limitations of evaluation and management by telemedicine and the availability of in person appointments. The patient expressed understanding and agreed to proceed.  Patient: Troy Sawyer   DOB: 01-09-1986   35 y.o. Male  MRN: [id number] Visit Date: 02/14/2021  Today's healthcare provider: Dortha Kern, PA-C   No chief complaint on file.  Subjective    HPI  This 35 year old male developed laryngitis on 02-11-21 after felling a tree at home. No fever, chills, headache or bodyaches. 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  Past Medical History:  Diagnosis Date  . GERD (gastroesophageal reflux disease)   . Kidney stone    Past Surgical History:  Procedure Laterality Date  . CYSTOSCOPY/URETEROSCOPY/HOLMIUM LASER/STENT PLACEMENT Left 04/13/2016   Procedure: CYSTOSCOPY/URETEROSCOPY/HOLMIUM LASER/STENT PLACEMENT;  Surgeon: Crist Fat, MD;  Location: [facility];  Service: Urology;  Laterality: Left;  . NO PAST SURGERIES     Social History   Tobacco Use  . Smoking status: Former Games developer  . Smokeless tobacco: Former Neurosurgeon  . Tobacco comment: quit in 2004  Substance Use Topics  . Alcohol use: Yes    Alcohol/week: 0.0 standard drinks    Comment: OCCASIONALLY  . Drug use: No   Family History  Problem Relation Age of Onset  . Healthy Mother   . Diabetes Father   .  Healthy Sister   . Healthy Brother   . Lung cancer Paternal Grandfather   . Liver cancer Paternal Grandfather   . Healthy Sister   . Healthy Brother   . Healthy Brother   . Prostate cancer Neg Hx   . Bladder Cancer Neg Hx   . Kidney cancer Neg Hx    No Known Allergies    Medications: Outpatient Medications Prior to Visit  Medication Sig  . Calcium Carbonate Antacid (ALKA-SELTZER ANTACID PO) Take by mouth. Chewable Tablet  . methocarbamol (ROBAXIN) 750 MG tablet Take 1 tablet (750 mg total) by mouth every 6 (six) hours as needed for muscle spasms.  . ranitidine (ZANTAC) 75 MG tablet Take 75 mg by mouth as needed for heartburn.  (Patient not taking: Reported on 10/04/2020)   No facility-administered medications prior to visit.    Review of Systems    Objective    There were no vitals taken for this visit.   Physical Exam: Very whispery/raspy voice. No apparent respiratory distress.     Assessment & Plan     1. Laryngitis, acute Will treat with prednisone taper and antibiotic to help shrink vocal cord edema. No respiratory distress or fever. Recheck prn. Drink plenty of fluids. - predniSONE (DELTASONE) 5 MG tablet; Taper down by 1 tablet by mouth daily starting at 6 day 1, 5 day 2, 4 day 3, 3 day 4, 2 day 5 and 1 day 6. Divide tablets among meals and bedtime.  Dispense: 21 tablet; Refill: 0 - amoxicillin (AMOXIL) 875  MG tablet; Take 1 tablet (875 mg total) by mouth 2 (two) times daily for 10 days.  Dispense: 20 tablet; Refill: 0    No follow-ups on file.     I discussed the assessment and treatment plan with the patient. The patient was provided an opportunity to ask questions and all were answered. The patient agreed with the plan and demonstrated an understanding of the instructions.   The patient was advised to call back or seek an in-person evaluation if the symptoms worsen or if the condition fails to improve as anticipated.  I provided 20 minutes of  non-face-to-face time during this encounter.  I, Enez Monahan, PA-C, have reviewed all documentation for this visit. The documentation on 02/14/21 for the exam, diagnosis, procedures, and orders are all accurate and complete.   Dortha Kern, PA-C [facility] [phone] (phone) [phone] (fax)  [facility]

## 2021-02-14 NOTE — Telephone Encounter (Signed)
Copied from CRM [id number]. Topic: General - Other >> Feb 14, 2021 12:28 PM Laural Benes, [place] C wrote: Reason for CRM: pt called in because he had an 11:20 apt with provider. Pt says that he never received/ connected with provider via mychart as expected. Called office several times to check status of pt's apt, no answer. Unsure of how to advise pt further in regards to his apt,    Please assist pt further as soon as possible. >> Feb 14, 2021 12:44 PM Jens Som A wrote: Pt called back while the office is closed for lunch. States that he was never able to connect with virtual appt with Maurine Minister. Please advise.

## 2023-02-08 ENCOUNTER — Ambulatory Visit: Payer: Self-pay | Admitting: *Deleted

## 2023-02-08 ENCOUNTER — Encounter: Payer: Self-pay | Admitting: Physician Assistant

## 2023-02-08 ENCOUNTER — Telehealth: Payer: Self-pay

## 2023-02-08 ENCOUNTER — Telehealth (INDEPENDENT_AMBULATORY_CARE_PROVIDER_SITE_OTHER): Payer: 59 | Admitting: Physician Assistant

## 2023-02-08 DIAGNOSIS — W57XXXA Bitten or stung by nonvenomous insect and other nonvenomous arthropods, initial encounter: Secondary | ICD-10-CM | POA: Diagnosis not present

## 2023-02-08 DIAGNOSIS — R21 Rash and other nonspecific skin eruption: Secondary | ICD-10-CM | POA: Insufficient documentation

## 2023-02-08 DIAGNOSIS — S30860A Insect bite (nonvenomous) of lower back and pelvis, initial encounter: Secondary | ICD-10-CM | POA: Insufficient documentation

## 2023-02-08 MED ORDER — DOXYCYCLINE HYCLATE 100 MG PO TABS: 100.0000 mg | ORAL_TABLET | Freq: Two times a day (BID) | ORAL | 0 refills | Status: DC

## 2023-02-08 NOTE — Telephone Encounter (Signed)
Error

## 2023-02-08 NOTE — Telephone Encounter (Signed)
  Chief Complaint: Tick Bite Symptoms: Pt removed several ticks from neck, leg, on Tues. Not aware of bite on right shoulder, noted today. States now with red ring around site "About 2 inches."  "Tiny tick."  Frequency: Noted redness today. Pertinent Negatives: Patient denies fever Disposition: ED /[] Urgent Care (no appt availability in office) / Appointment(In office/virtual)/  [facility]/ Home Care/ Refused Recommended Disposition /[] [facility]/  Follow-up with PCP Additional Notes: Pt is out of town, secured virtual appt.for today. Care advise provided. Pt verbalizes understanding.  Reason for Disposition  [1] Red streak or red line AND [2] length > 2 inches (5 cm)  Answer Assessment - Initial Assessment Questions 1. ATTACHED:  "Is the tick still on the skin?"  (e.g., yes, no, unsure)     No 2. ONSET - TICK STILL ATTACHED:  "How long do you think the tick has been on your skin?" (e.g., hours, days, unsure)  Note:  Is there a recent activity (camping, hiking) where the caller may have been exposed?     No 3. ONSET - TICK NOT STILL ATTACHED: "If the tick has been removed, how long do you think the tick was attached before you removed it?" (e.g., 5 hours, 2 days). "When was this?"     no 4. LOCATION: "Where is the tick bite located?" (e.g., arm, leg)     Right leg, behind knee, right shoulder 5. TYPE of TICK: "Is it a wood tick or a deer tick?" (e.g., deer tick, wood tick; unsure)     Back of leg tiny. Neck larger, with white spot on neck 6. SIZE of TICK: "How big is the tick?" (e.g., size of poppy seed, apple seed, watermelon seed; unsure) Note: Deer ticks can be the size of a poppy seed (nymph) or an apple seed (adult).       *No Answer* 7. ENGORGED: "Did the tick look flat or engorged (full, swollen)?" (e.g., flat, engorged; unsure)     *No Answer* 8. OTHER SYMPTOMS: "Do you have any other symptoms?" (e.g., fever, rash, redness at bite area, red ring  around bite)     Red ring  Protocols used: Tick Bite-A-AH

## 2023-02-08 NOTE — Progress Notes (Signed)
Vivien Rota DeSanto,acting as a Neurosurgeon for [employer], PA-C.,have documented all relevant documentation on the behalf of Debera Lat, PA-C,as directed by  [employer], PA-C while in the presence of [employer], PA-C.   MyChart Video Visit    Virtual Visit via Video Note   This format is felt to be most appropriate for this patient at this time. Physical exam was limited by quality of the video and audio technology used for the visit.   Patient location: home Provider location: office  I discussed the limitations of evaluation and management by telemedicine and the availability of in person appointments. The patient expressed understanding and agreed to proceed.  Patient: Troy Sawyer   DOB: [birth year]   37 y.o. Male  MRN: [id number] Visit Date: 02/08/2023  Today's healthcare provider: Debera Lat, PA-C   CC: rash after tick bite x 5 days  Subjective    HPI  Patient is a 37 year old male who presents via video visit for evaluation of possible tick bite.  He states he He removed a tick on 02/02/23.  He had some itching in that area beginning on 02/04/23.  Today he and his wife noticed a red circle that was raised and warm to the touch at the site of where he thinks the tick was.  He states this area is swollen to the size of a small first or 3.5-4 inch diameter.  He reports it may have been present before today but not noticed due to a tattoo he has at the site.   He denies any GI symptoms, headache, dizziness or other rashes.  He states he had cleaned it with alcohol and other than it itching some it hasn't really bothered him that much.   Medications: Outpatient Medications Prior to Visit  Medication Sig   Calcium Carbonate Antacid (ALKA-SELTZER ANTACID PO) Take by mouth. Chewable Tablet   methocarbamol (ROBAXIN) 750 MG tablet Take 1 tablet (750 mg total) by mouth every 6 (six) hours as needed for muscle spasms.   predniSONE (DELTASONE) 5 MG tablet Taper down by 1 tablet by  mouth daily starting at 6 day 1, 5 day 2, 4 day 3, 3 day 4, 2 day 5 and 1 day 6. Divide tablets among meals and bedtime.   ranitidine (ZANTAC) 75 MG tablet Take 75 mg by mouth as needed for heartburn.  (Patient not taking: Reported on 10/04/2020)   No facility-administered medications prior to visit.    Review of Systems  Constitutional:  Negative for fatigue and fever.  Eyes:  Negative for pain and redness.  Respiratory:  Negative for shortness of breath.   Cardiovascular:  Negative for chest pain and palpitations.  Gastrointestinal:  Negative for abdominal pain, constipation, diarrhea, nausea and vomiting.  Musculoskeletal:  Negative for arthralgias and gait problem.  Skin:  Positive for wound (site of tick bite). Negative for rash.  Neurological:  Negative for dizziness, weakness, light-headedness, numbness and headaches.       Objective    There were no vitals taken for this visit.     Physical Exam Constitutional:      General: He is not in acute distress.    Appearance: Normal appearance. He is not diaphoretic.  HENT:     Head: Normocephalic.  Eyes:     Conjunctiva/sclera: Conjunctivae normal.  Pulmonary:     Effort: Pulmonary effort is normal. No respiratory distress.  Neurological:     Mental Status: He is alert and oriented to person, place,  and time. Mental status is at baseline.    Photo of current rash was sent as an attachment by patient. Looks like round, erythematous lesion that expands in diameter over days with an area of central clearing on the thoracic back    Assessment & Plan      1. Rash x 5 days, after tick bite, could be EM 2. Tick bite of back, initial encounter x 7 days Acute problem , could be early localized Lyme disease or other tick-borne disease Mostly pruritic Advised to use symptomatic treatment: ice, cool compresses, hydrocortisone cream and OTC benadryl at night History of a tick bite followed by Erythema Multiforme (EM) and/or  illness an expansion of lesion with warmth and swelling Advised an oral antibiotic treatment to prevent progression of possible infection to the late stage and to reduce a duration of symptoms - doxycycline (VIBRA-TABS) 100 MG tablet; Take 1 tablet (100 mg total) by mouth 2 (two) times daily.  Dispense: 28 tablet; Refill: 0  I discussed the assessment and treatment plan with the patient. The patient was provided an opportunity to ask questions and all were answered. The patient agreed with the plan and demonstrated an understanding of the instructions.  No follow-ups on file. Pt was seen last in 2021. Advised to schedule a CPE.     The patient was advised to call back or seek an in-person evaluation if the symptoms worsen or if the condition fails to improve as anticipated.  I provided 22 minutes of non-face-to-face time during this encounter.  I, Debera Lat, PA-C have reviewed all documentation for this visit. The documentation on  02/08/23 for the exam, diagnosis, procedures, and orders are all accurate and complete.  Debera Lat, [facility], MMS [facility] [phone] (phone) [phone] (fax)   [facility]

## 2024-09-27 ENCOUNTER — Telehealth: Payer: Self-pay | Admitting: Physician Assistant

## 2024-09-27 NOTE — Telephone Encounter (Unsigned)
 Copied from CRM #[id number]. Topic: Clinical - Lab/Test Results >> Sep 27, 2024 11:14 AM Tiffini S wrote: Reason for CRM: Patient would like to discuss testing for Calcium and Testosterone  Please call the patient back at [phone]

## 2024-11-03 ENCOUNTER — Ambulatory Visit: Admitting: Family Medicine

## 2024-11-03 ENCOUNTER — Encounter: Payer: Self-pay | Admitting: Family Medicine

## 2024-11-03 VITALS — BP 117/83 | HR 78 | Temp 98.1°F | Ht 72.0 in | Wt 243.4 lb

## 2024-11-03 DIAGNOSIS — Z23 Encounter for immunization: Secondary | ICD-10-CM

## 2024-11-03 DIAGNOSIS — Z0000 Encounter for general adult medical examination without abnormal findings: Secondary | ICD-10-CM

## 2024-11-03 DIAGNOSIS — E7800 Pure hypercholesterolemia, unspecified: Secondary | ICD-10-CM

## 2024-11-03 DIAGNOSIS — Z8249 Family history of ischemic heart disease and other diseases of the circulatory system: Secondary | ICD-10-CM

## 2024-11-03 DIAGNOSIS — Z114 Encounter for screening for human immunodeficiency virus [HIV]: Secondary | ICD-10-CM

## 2024-11-03 DIAGNOSIS — Z1159 Encounter for screening for other viral diseases: Secondary | ICD-10-CM

## 2024-11-03 DIAGNOSIS — G479 Sleep disorder, unspecified: Secondary | ICD-10-CM

## 2024-11-03 DIAGNOSIS — R6882 Decreased libido: Secondary | ICD-10-CM
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
## 2024-11-28 ENCOUNTER — Other Ambulatory Visit

## 2025-03-06 ENCOUNTER — Encounter

## 2025-03-16 ENCOUNTER — Encounter

## 2025-11-07 ENCOUNTER — Encounter
